# Patient Record
Sex: Female | Born: 1955 | Race: White | Hispanic: No | Marital: Single | Smoking: Never smoker
Health system: Southern US, Community
[De-identification: ages and names within clinical notes are randomized; demographics above are authoritative.]

## PROBLEM LIST (undated history)

## (undated) DIAGNOSIS — I82A19 Acute embolism and thrombosis of unspecified axillary vein: Secondary | ICD-10-CM

## (undated) DIAGNOSIS — G610 Guillain-Barre syndrome: Secondary | ICD-10-CM

## (undated) DIAGNOSIS — Z87442 Personal history of urinary calculi: Secondary | ICD-10-CM

## (undated) HISTORY — DX: Guillain-Barre syndrome: G61.0

## (undated) HISTORY — PX: COLONOSCOPY WITH ESOPHAGOGASTRODUODENOSCOPY (EGD): SHX5779

## (undated) HISTORY — PX: OTHER SURGICAL HISTORY: SHX169

---

## 1988-02-08 DIAGNOSIS — G610 Guillain-Barre syndrome: Secondary | ICD-10-CM

## 1988-02-08 HISTORY — DX: Guillain-Barre syndrome: G61.0

## 2017-09-20 ENCOUNTER — Other Ambulatory Visit: Payer: Self-pay | Admitting: Urology

## 2017-10-10 NOTE — Pre-Procedure Instructions (Signed)
Dr. Amado Coe needs to reschedule procedure 10/13/2017 she will contact Dr. Emmaline Life office. I left a voicemail with Selita also.

## 2017-10-13 ENCOUNTER — Encounter (HOSPITAL_BASED_OUTPATIENT_CLINIC_OR_DEPARTMENT_OTHER): Payer: Self-pay

## 2017-10-13 ENCOUNTER — Ambulatory Visit (HOSPITAL_BASED_OUTPATIENT_CLINIC_OR_DEPARTMENT_OTHER): Admit: 2017-10-13 | Payer: Self-pay | Admitting: Urology

## 2017-10-13 SURGERY — CYSTOSCOPY, WITH BLADDER CALCULUS LITHOLAPAXY
Anesthesia: General

## 2017-11-07 DIAGNOSIS — I82A19 Acute embolism and thrombosis of unspecified axillary vein: Secondary | ICD-10-CM

## 2017-11-07 HISTORY — DX: Acute embolism and thrombosis of unspecified axillary vein: I82.A19

## 2017-11-27 ENCOUNTER — Ambulatory Visit (INDEPENDENT_AMBULATORY_CARE_PROVIDER_SITE_OTHER): Payer: Self-pay

## 2017-11-27 ENCOUNTER — Encounter (INDEPENDENT_AMBULATORY_CARE_PROVIDER_SITE_OTHER): Payer: Self-pay | Admitting: Orthopaedic Surgery

## 2017-11-27 ENCOUNTER — Ambulatory Visit (INDEPENDENT_AMBULATORY_CARE_PROVIDER_SITE_OTHER): Payer: BLUE CROSS/BLUE SHIELD | Admitting: Orthopaedic Surgery

## 2017-11-27 DIAGNOSIS — M25572 Pain in left ankle and joints of left foot: Secondary | ICD-10-CM | POA: Diagnosis not present

## 2017-11-27 DIAGNOSIS — S8265XA Nondisplaced fracture of lateral malleolus of left fibula, initial encounter for closed fracture: Secondary | ICD-10-CM

## 2017-11-27 DIAGNOSIS — M25561 Pain in right knee: Secondary | ICD-10-CM | POA: Diagnosis not present

## 2017-11-27 NOTE — Progress Notes (Signed)
Office Visit Note   Patient: Gabriella Campbell           Date of Birth: 08-14-1955           MRN: [id number] Visit Date: 11/27/2017              Requested by: No referring provider defined for this encounter. PCP: System, Pcp Not In   Assessment & Plan: Visit Diagnoses:  1. Pain in left ankle and joints of left foot   2. Acute pain of right knee   3. Nondisplaced fracture of lateral malleolus of left fibula, initial encounter for closed fracture     Plan: She understands that she does have a stable lateral malleolus fracture of her left ankle.  She will do well in a functional fracture brace or cam walking boot.  The knee seems to doing well so it is going to watch that given there is no fracture.  She can weight-bear as tolerated and we will see her back in 3 weeks with a repeat 3 views of her left ankle.  All question concerns were answered and addressed.  Follow-Up Instructions: Return in about 3 weeks (around 12/18/2017).   Orders:  Orders Placed This Encounter  Procedures  . XR Ankle Complete Left  . XR Foot Complete Left  . XR Knee 1-2 Views Right   No orders of the defined types were placed in this encounter.     Procedures: No procedures performed   Clinical Data: No additional findings.   Subjective: No chief complaint on file. 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  This occurred on day 1 of her traveling when she actually tripped and fell down a step.  She reports global ankle pain and swelling mainly laterally though on the left ankle and anterior knee pain on the right knee.  She did report significant swelling and bruising right after the injury.  HPI  Review of Systems She currently denies any headache, chest pain, short of breath, fever, chills, nausea, vomiting.  Objective: Vital Signs: There were no  vitals taken for this visit.  Physical Exam She is alert and oriented x3 in no acute distress Ortho Exam Examination of her left ankle does show global swelling pain.  Most of her pain is lateral but there is some medial pain and pain over the Achilles but the Achilles is intact.  Her foot is well-perfused with normal sensation and is clinically well located.  Examination of her right knee shows pain to ballottement over the patella and some of the patella tendon itself.  Her extensor mechanism is intact.  There is no medial lateral joint line tenderness and there is no significant effusion or warmth in the joint itself. Specialty Comments:  No specialty comments available.  Imaging: Xr Ankle Complete Left  Result Date: 11/27/2017 3 views of the left ankle show a nondisplaced lateral malleolus fracture distal to the ankle mortise.  The mortise remains intact.  Xr Knee 1-2 Views Right  Result Date: 11/27/2017 2 views of the right knee show no acute findings.  There is slight prepatellar swelling.    PMFS History: There are no active problems to display for this patient.  History reviewed. No pertinent past medical history.  History reviewed. No pertinent family history.  History reviewed. No pertinent surgical history. Social History  Occupational History  . Not on file  Tobacco Use  . Smoking status: Not on file  Substance and Sexual Activity  . Alcohol use: Not on file  . Drug use: Not on file  . Sexual activity: Not on file

## 2017-12-11 ENCOUNTER — Telehealth (INDEPENDENT_AMBULATORY_CARE_PROVIDER_SITE_OTHER): Payer: Self-pay | Admitting: Orthopaedic Surgery

## 2017-12-11 NOTE — Telephone Encounter (Signed)
Please advise 

## 2017-12-11 NOTE — Telephone Encounter (Signed)
Patient left a vm in regards to the A Boot that was put on her foot at the last visit.  It was hard to understand her on the vm, but I believe she said that she is now experiencing some swelling.  CB#[phone].  Thank you.

## 2017-12-11 NOTE — Telephone Encounter (Signed)
Needs to elevate

## 2017-12-12 NOTE — Telephone Encounter (Signed)
Now states pain hurts into her calf

## 2017-12-12 NOTE — Telephone Encounter (Signed)
Can we get her in for a doppler today or tomorrow?

## 2017-12-12 NOTE — Telephone Encounter (Signed)
Gabriella Campbell,  This originated from [place], may have been sent to me in error.

## 2017-12-12 NOTE — Telephone Encounter (Signed)
Needs a dopplar ultrasound to rule out a DVT.

## 2017-12-13 ENCOUNTER — Ambulatory Visit (HOSPITAL_COMMUNITY)
Admission: RE | Admit: 2017-12-13 | Discharge: 2017-12-13 | Disposition: A | Discharge status: Home / Self Care | Payer: BLUE CROSS/BLUE SHIELD | Source: Ambulatory Visit | Attending: Cardiovascular Disease | Admitting: Cardiovascular Disease

## 2017-12-13 ENCOUNTER — Other Ambulatory Visit (INDEPENDENT_AMBULATORY_CARE_PROVIDER_SITE_OTHER): Payer: Self-pay | Admitting: Orthopaedic Surgery

## 2017-12-13 ENCOUNTER — Other Ambulatory Visit (INDEPENDENT_AMBULATORY_CARE_PROVIDER_SITE_OTHER): Payer: Self-pay

## 2017-12-13 ENCOUNTER — Telehealth: Payer: Self-pay | Admitting: *Deleted

## 2017-12-13 ENCOUNTER — Telehealth: Payer: Self-pay | Admitting: Internal Medicine

## 2017-12-13 DIAGNOSIS — M79605 Pain in left leg: Secondary | ICD-10-CM

## 2017-12-13 MED ORDER — RIVAROXABAN (XARELTO) VTE STARTER PACK (15 & 20 MG): ORAL_TABLET | ORAL | 0 refills | Status: DC

## 2017-12-13 MED ORDER — RIVAROXABAN 20 MG PO TABS: 20.0000 mg | ORAL_TABLET | Freq: Every day | ORAL | 1 refills | Status: DC

## 2017-12-13 NOTE — Telephone Encounter (Signed)
Called and spoke with Dr. Amado Coe. She has been scheduled to see MR on 12/19/17 (double booked) she does have prescription of xarelto. Nothing further needed.

## 2017-12-13 NOTE — Telephone Encounter (Signed)
Patient in office for venous doppler, positive for DVT. Per Dr Duke Salvia will start Xarelto 15 mg twice a day for 21 days and then 20 mg daily for total of 3 months. Patient aware and confirmed with [facility] Rx received.

## 2017-12-13 NOTE — Telephone Encounter (Signed)
Pt has appt scheduled today at [facility] heart and vascular at [facility] at 2:00pm, pt aware of appt

## 2017-12-13 NOTE — Telephone Encounter (Signed)
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 She hs new DVT 12/13/2017 after trip tp [place]  Plan  - I am told someone was able to get her a script of xarelto v eliquis - please confirm - Please give her PM appt 30 min mid -afternoon (ok to double book ) or put in 15 min slot for new consult. She told Dr Loreta Ave she wants to see me  Thanks    SIGNATURE    Dr. Kalman Shan, M.D., F.C.C.P,  Pulmonary and Critical Care Medicine Staff Physician, [facility] Center Director - Interstitial Lung Disease  Program  Pulmonary Fibrosis [facility] at  [facility], [place], [address]  Pager: [phone], If no answer or between  15:00h - 7:00h: call [phone] Telephone: [phone]  3:31 PM 12/13/2017

## 2017-12-13 NOTE — Telephone Encounter (Signed)
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  She is in a CAM walker boot due to L lateral malleolar fracture and will remain in it for 9 weeks.  She has no other significant medical history, renal function was within normal limits two months ago, and she has no history of significant bleeding.  He was found to have thrombosis of the tibial vein.  We will plan to anticoagulate with Xarelto 15mg  bid x21 days followed by 20mg  daily for a total of 3 months.  This could potentially be followed with serial ultrasound.  However, given that she will remain with limited mobility, will need a boot, and is low risk for bleed, would favor anticoagulation.    Iram Astorino C. Duke Salvia, MD, [facility] 12/13/2017 3:09 PM

## 2017-12-14 ENCOUNTER — Other Ambulatory Visit: Payer: Self-pay | Admitting: Internal Medicine

## 2017-12-14 DIAGNOSIS — I824Y9 Acute embolism and thrombosis of unspecified deep veins of unspecified proximal lower extremity: Secondary | ICD-10-CM

## 2017-12-18 ENCOUNTER — Ambulatory Visit (INDEPENDENT_AMBULATORY_CARE_PROVIDER_SITE_OTHER): Payer: BLUE CROSS/BLUE SHIELD

## 2017-12-18 ENCOUNTER — Encounter (INDEPENDENT_AMBULATORY_CARE_PROVIDER_SITE_OTHER): Payer: Self-pay | Admitting: Orthopaedic Surgery

## 2017-12-18 ENCOUNTER — Ambulatory Visit (HOSPITAL_COMMUNITY)
Admission: RE | Admit: 2017-12-18 | Discharge: 2017-12-18 | Disposition: A | Discharge status: Home / Self Care | Payer: BLUE CROSS/BLUE SHIELD | Source: Ambulatory Visit | Attending: Cardiology | Admitting: Cardiology

## 2017-12-18 ENCOUNTER — Ambulatory Visit (INDEPENDENT_AMBULATORY_CARE_PROVIDER_SITE_OTHER): Payer: BLUE CROSS/BLUE SHIELD | Admitting: Orthopaedic Surgery

## 2017-12-18 DIAGNOSIS — I824Y2 Acute embolism and thrombosis of unspecified deep veins of left proximal lower extremity: Secondary | ICD-10-CM | POA: Diagnosis not present

## 2017-12-18 DIAGNOSIS — S8265XD Nondisplaced fracture of lateral malleolus of left fibula, subsequent encounter for closed fracture with routine healing: Secondary | ICD-10-CM | POA: Diagnosis not present

## 2017-12-18 DIAGNOSIS — I824Y9 Acute embolism and thrombosis of unspecified deep veins of unspecified proximal lower extremity: Secondary | ICD-10-CM | POA: Insufficient documentation

## 2017-12-18 NOTE — Progress Notes (Signed)
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  She is about a month out from a trip overseas in which she sustained a left ankle injury.  She is found to have a lateral malleolus fracture that was a stable fracture.  She is in a walking boot.  She does get a lot of swelling and pitting edema when she is been up on her leg.  She did have a DVT screen recent with an ultrasound that showed a blood clot in the gastroc area of the leg but not deep.  It was recommended she start a blood thinning medication but she is at least consulted with other physicians and she is going to just follow this right now with serial ultrasounds.  On exam her calf is not significantly swollen.  She still has a little bit of medial and lateral tenderness around the left ankle but her range of motion is doing well and the ankle is stable.  The swelling is significantly less than her last visit.  She still has some pain over the patella on the right side but there is no fracture and her extensor mechanism is intact and there is no knee joint effusion.  Work on transitioning to an ASO for her left ankle this standpoint.  We will see her back for potential final visit in 4 weeks with a repeat 3 views of her left ankle.  All question concerns were answered and addressed.

## 2017-12-18 NOTE — Progress Notes (Signed)
Will route to Dr. Marchelle Gearing as he is seeing the patient this week and can review.   Summary: Right: No evidence of common femoral vein obstruction. Left: Findings consistent with continued deep vein thrombosis involving the left gastocnemius vein, and left posterior tibial vein. Findings appear essentially unchanged compared to previous examination. All other veins visualized appear fully compressible and demonstrate appropriate Doppler characteristics.  Gabriella Headland FNP

## 2017-12-19 ENCOUNTER — Other Ambulatory Visit (INDEPENDENT_AMBULATORY_CARE_PROVIDER_SITE_OTHER): Payer: BLUE CROSS/BLUE SHIELD

## 2017-12-19 ENCOUNTER — Ambulatory Visit (INDEPENDENT_AMBULATORY_CARE_PROVIDER_SITE_OTHER): Payer: BLUE CROSS/BLUE SHIELD | Admitting: Internal Medicine

## 2017-12-19 ENCOUNTER — Telehealth: Payer: Self-pay | Admitting: Internal Medicine

## 2017-12-19 ENCOUNTER — Encounter: Payer: Self-pay | Admitting: Internal Medicine

## 2017-12-19 VITALS — BP 132/84 | HR 96 | Ht 66.0 in | Wt 219.0 lb

## 2017-12-19 DIAGNOSIS — I824Z2 Acute embolism and thrombosis of unspecified deep veins of left distal lower extremity: Secondary | ICD-10-CM

## 2017-12-19 LAB — CBC WITH DIFFERENTIAL/PLATELET
BASOS ABS: 0.1 10*3/uL (ref 0.0–0.1)
Basophils Relative: 0.5 % (ref 0.0–3.0)
Eosinophils Absolute: 0.2 10*3/uL (ref 0.0–0.7)
Eosinophils Relative: 2.2 % (ref 0.0–5.0)
HEMATOCRIT: 39.7 % (ref 36.0–46.0)
HEMOGLOBIN: 13.3 g/dL (ref 12.0–15.0)
LYMPHS PCT: 25.4 % (ref 12.0–46.0)
Lymphs Abs: 2.4 10*3/uL (ref 0.7–4.0)
MCHC: 33.6 g/dL (ref 30.0–36.0)
MCV: 84.4 fl (ref 78.0–100.0)
MONO ABS: 0.8 10*3/uL (ref 0.1–1.0)
Monocytes Relative: 8.1 % (ref 3.0–12.0)
NEUTROS ABS: 5.9 10*3/uL (ref 1.4–7.7)
Neutrophils Relative %: 63.8 % (ref 43.0–77.0)
PLATELETS: 430 10*3/uL — AB (ref 150.0–400.0)
RBC: 4.7 Mil/uL (ref 3.87–5.11)
RDW: 13.6 % (ref 11.5–15.5)
WBC: 9.3 10*3/uL (ref 4.0–10.5)

## 2017-12-19 LAB — HEPATIC FUNCTION PANEL
ALBUMIN: 4.2 g/dL (ref 3.5–5.2)
ALK PHOS: 82 U/L (ref 39–117)
ALT: 4 U/L (ref 0–35)
AST: 11 U/L (ref 0–37)
BILIRUBIN DIRECT: 0 mg/dL (ref 0.0–0.3)
TOTAL PROTEIN: 6.8 g/dL (ref 6.0–8.3)
Total Bilirubin: 0.4 mg/dL (ref 0.2–1.2)

## 2017-12-19 LAB — BASIC METABOLIC PANEL
BUN: 17 mg/dL (ref 6–23)
CALCIUM: 9.4 mg/dL (ref 8.4–10.5)
CO2: 29 mEq/L (ref 19–32)
CREATININE: 0.88 mg/dL (ref 0.40–1.20)
Chloride: 103 mEq/L (ref 96–112)
GFR: 69.5 mL/min (ref >60.00)
Glucose, Bld: 101 mg/dL — ABNORMAL HIGH (ref 70–99)
Potassium: 4.1 mEq/L (ref 3.5–5.1)
Sodium: 139 mEq/L (ref 135–145)

## 2017-12-19 LAB — D-DIMER, QUANTITATIVE (NOT AT ARMC): D DIMER QUANT: 1.19 ug{FEU}/mL — AB (ref <0.50)

## 2017-12-19 MED ORDER — APIXABAN 2.5 MG PO TABS: 2.5000 mg | ORAL_TABLET | Freq: Two times a day (BID) | ORAL | 0 refills | Status: DC

## 2017-12-19 NOTE — Progress Notes (Addendum)
Subjective:    Patient ID: Gabriella Campbell, female    DOB: July 31, 1955, 62 y.o.   MRN: [id number]  HPI   IOV 12/19/2017  Chief Complaint  Patient presents with  . Consult    DVT in left leg.    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  She is originally from [place].  Her primary home based on the [place] is [place] but but now living in [place], [place].  Over a month ago she went to [place] with some of her friends and on her second day while rushing to get into the hotel entry she tripped and fell and and sustained a left foot injury.  She was unaware that this later would be diagnosed as a left lateral malleolus fracture and left fibula fracture per hx.  She spent 8 days and agrees weightbearing with the ankle and continuing with activities.  She returned approximately 3 weeks ago and then shortly thereafter started noticing left ankle swelling with pain.  Then on November 27, 2017 was diagnosed formally with the left lateral malleolus fracture.  She is in a boot since then.  Somewhere along the way she started noticing left calf pain and swelling.  Then on December 13, 2017 had a duplex lower extremity that showed below-knee DVT in the left gastrocnemius in the left posterior tibial vein.  This was done at the cardiology clinic.  She was given Xarelto.  However she is nervous about anticoagulation because of bleeding risk.  Therefore she made this appointment and is here to discuss anticoagulation treatment for the DVT.  She does not have a local primary care physician.  She has not had any shortness of breath.  She has reviewed the literature extensively.  She had a follow-up duplex ultrasound number 12/2017 yesterday and the DVT is stable without progression.  Based on literature review she believes that she is at low risk for progression and she believes the best approach is to do serial monitoring of duplex  ultrasound.  She has started herself on a baby dose aspirin.  She says she is called [facility] [place] [facility] and there they do not treat below-knee lower extremity DVT with anticoagulation especially of the risk for progression is low.  However she believes in [place] doctors recommend anticoagulation.  She has upcoming trip to [place] by flight over Thanksgiving.  She continues to be in immobilizer boot for her left lower extremity.   She has never had deep vein thrombosis or pulmonary embolism in the past.  No history of malignancy.  No smoking.  No hormone replacement treatment.  No tamoxifen.  No family history of blood clots.  No other risk factors for blood clots.  History of bleeding reviewed.  She has normal bleeding tendencies.  No history of abnormal bleeding.  She believes her lab work if that would be normal.  She has never had any cranial trauma.  Based on risk v benefit - she is willing / open to idea of doing low dose anticoagulation with NOAC (eliquis preferred) for 4 weeks with serial duplex during this time with D-dimer risk assessment    has a past medical history of Guillain Barr syndrome (HCC).   reports that she has never smoked. She has never used smokeless tobacco.  No past surgical history on file.  Allergies  Allergen Reactions  . Penicillins      There is no immunization history  on file for this patient.  No family history on file.   Current Outpatient Medications:  .  aspirin 81 MG tablet, Take 162 mg by mouth daily., Disp: , Rfl:  .  apixaban (ELIQUIS) 2.5 MG TABS tablet, Take 1 tablet (2.5 mg total) by mouth 2 (two) times daily., Disp: 84 tablet, Rfl: 0    Review of Systems  Constitutional: Negative for fever.  HENT: Negative for congestion, dental problem, ear pain, nosebleeds, postnasal drip, rhinorrhea, sinus pressure, sneezing, sore throat and trouble swallowing.   Eyes: Negative for redness and itching.  Respiratory: Negative  for cough, chest tightness, shortness of breath and wheezing.   Cardiovascular: Positive for leg swelling. Negative for palpitations.  Gastrointestinal: Negative for nausea and vomiting.  Genitourinary: Negative for dysuria.  Musculoskeletal: Negative for joint swelling.  Skin: Negative for rash.  Allergic/Immunologic: Negative.  Negative for environmental allergies, food allergies and immunocompromised state.  Neurological: Negative for headaches.  Hematological: Does not bruise/bleed easily.  Psychiatric/Behavioral: Negative for dysphoric mood. The patient is not nervous/anxious.        Objective:   Physical Exam  Constitutional: She is oriented to person, place, and time. She appears well-developed and well-nourished. No distress.  HENT:  Head: Normocephalic and atraumatic.  Right Ear: External ear normal.  Left Ear: External ear normal.  Mouth/Throat: Oropharynx is clear and moist. No oropharyngeal exudate.  Eyes: Pupils are equal, round, and reactive to light. Conjunctivae and EOM are normal. Right eye exhibits no discharge. Left eye exhibits no discharge. No scleral icterus.  Neck: Normal range of motion. Neck supple. No JVD present. No tracheal deviation present. No thyromegaly present.  Cardiovascular: Normal rate, regular rhythm, normal heart sounds and intact distal pulses. Exam reveals no gallop and no friction rub.  No murmur heard. Pulmonary/Chest: Effort normal and breath sounds normal. No respiratory distress. She has no wheezes. She has no rales. She exhibits no tenderness.  Abdominal: Soft. Bowel sounds are normal. She exhibits no distension and no mass. There is no tenderness. There is no rebound and no guarding.  Musculoskeletal: Normal range of motion. She exhibits no edema or tenderness.  LLE in boot and ted stockings  Lymphadenopathy:    She has no cervical adenopathy.  Neurological: She is alert and oriented to person, place, and time. She has normal reflexes. No  cranial nerve deficit. She exhibits normal muscle tone. Coordination normal.  Skin: Skin is warm and dry. No rash noted. She is not diaphoretic. No erythema. No pallor.  Psychiatric: She has a normal mood and affect. Her behavior is normal. Judgment and thought content normal.  Vitals reviewed.   Vitals:   12/19/17 1453  BP: 132/84  Pulse: 96  SpO2: 96%  Weight: 219 lb (99.3 kg)  Height: 5\' 6"  (1.676 m)    Estimated body mass index is 35.35 kg/m as calculated from the following:   Height as of this encounter: 5\' 6"  (1.676 m).   Weight as of this encounter: 219 lb (99.3 kg).       Assessment & Plan:     ICD-10-CM   1. Acute deep vein thrombosis (DVT) of distal vein of left lower extremity (HCC) I82.4Z2 D-Dimer, Quantitative    CBC with Differential    Basic Metabolic Panel (BMET)    Hepatic function panel    VAS [place] LOWER EXTREMITY VENOUS (DVT)  \   Patient Instructions     ICD-10-CM   1. Acute deep vein thrombosis (DVT) of distal vein of  left lower extremity (HCC) I82.4Z2     Stable between 12/13/17 and 12/16/17 - distal left lower extremity  - related to travel and fracture  Risk factors for progression per uptodate  Patients considered by their clinician to be at risk of extension to the proximal veins. This includes patients with:  -Unprovoked DVT -D-dimer >500 ng/mL -Extensive thrombosis involving multiple veins (eg, >5 cm in length, >7 mm in diameter) -Thrombosis close to the proximal veins -Persistent/irreversible risk factors such as active cancer [25] -Prior DVT or PE -Prolonged immobility -Inpatient status - No progression in 2 weeks on serial US  PLAN - agree in your risk for progression is low but prolonged cast and upcoming flight travel do make me concerned there is some risk for progression  - check D-dimer 12/19/2017 - to assess progression risk  - check cbc, bmet, lft 12/19/2017  to assess bleeding risk - HAS-BLEED SCORE - per uptodate Most  clinicians agree that patients with a three-month bleeding risk of less than 2 percent (low risk) should be anticoagulated. In addition, most clinicians agree that patients with a three-month bleeding risk of more than 13 percent (high risk) should not be anticoagulated  - check repeat US of legs in 1 week  - we agreed to do eliquis low dose 2.5mg  twice daily for 4 weeks and reasess - samples given  - you can hjold off baby aspirin while taking eliquis  Followup = await my call with results 12/20/17 - see you in 4-6 weeks followup - be in touch over phone     SIGNATURE    Dr. Kalman ShanMurali Beni Turrell, M.D., F.C.C.P,  Pulmonary and Critical Care Medicine Staff Physician, [facility] Center Director - Interstitial Lung Disease  Program  Pulmonary Fibrosis [facility] at [facility] [place], [place], [id number]  Pager: [phone], If no answer or between  15:00h - 7:00h: call [phone] Telephone: [phone]  5:36 PM 12/19/2017

## 2017-12-19 NOTE — Patient Instructions (Addendum)
ICD-10-CM   1. Acute deep vein thrombosis (DVT) of distal vein of left lower extremity (HCC) I82.4Z2     Stable between 12/13/17 and 12/16/17 - distal left lower extremity  - related to travel and fracture  Risk factors for progression per uptodate  Patients considered by their clinician to be at risk of extension to the proximal veins. This includes patients with:  -Unprovoked DVT -D-dimer >500 ng/mL -Extensive thrombosis involving multiple veins (eg, >5 cm in length, >7 mm in diameter) -Thrombosis close to the proximal veins -Persistent/irreversible risk factors such as active cancer [25] -Prior DVT or PE -Prolonged immobility -Inpatient status - No progression in 2 weeks on serial US  PLAN - agree in your risk for progression is low but prolonged cast and upcoming flight travel do make me concerned there is some risk for progression  - check D-dimer 12/19/2017 - to assess progression risk  - check cbc, bmet, lft 12/19/2017  to assess bleeding risk - HAS-BLEED SCORE - per uptodate Most clinicians agree that patients with a three-month bleeding risk of less than 2 percent (low risk) should be anticoagulated. In addition, most clinicians agree that patients with a three-month bleeding risk of more than 13 percent (high risk) should not be anticoagulated  - check repeat US of legs in 1 week  - we agreed to do eliquis low dose 2.5mg  twice daily for 4 weeks and reasess - samples given  - you can hjold off baby aspirin while taking eliquis  Followup = await my call with results 12/20/17 - see you in 4-6 weeks followup - be in touch over phone

## 2017-12-20 NOTE — Progress Notes (Signed)
These results were reviewed with Dr. Marchelle Gearingamaswamy at office visit on 12/19/2017 see documentation.  Elisha HeadlandBrian Earle Burson FNP

## 2017-12-20 NOTE — Telephone Encounter (Signed)
Please let Gabriella Campbell know that labs normal but for - d-dimer high 1.19 and also platelet around 430.  Without aspirin her bleeding risk score is very very low. So, acceptable risk for low dose eliquis 2.5mg  twice daily which I think she should take giving high d-dimer . Rest of plan per OV yesterday

## 2017-12-21 NOTE — Telephone Encounter (Signed)
Called and spoke with pt letting her know the results of the labwork and based on the d-dimer, MR said it would be okay for her to do low dose eliquis 2.5mg  bid.  Pt expressed understanding. Nothing further needed.

## 2017-12-25 ENCOUNTER — Ambulatory Visit (HOSPITAL_COMMUNITY)
Admission: RE | Admit: 2017-12-25 | Discharge: 2017-12-25 | Disposition: A | Discharge status: Home / Self Care | Payer: BLUE CROSS/BLUE SHIELD | Source: Ambulatory Visit | Attending: Cardiology | Admitting: Cardiology

## 2017-12-25 DIAGNOSIS — I824Z2 Acute embolism and thrombosis of unspecified deep veins of left distal lower extremity: Secondary | ICD-10-CM | POA: Diagnosis not present

## 2017-12-26 ENCOUNTER — Telehealth: Payer: Self-pay | Admitting: Internal Medicine

## 2017-12-26 DIAGNOSIS — I82462 Acute embolism and thrombosis of left calf muscular vein: Secondary | ICD-10-CM

## 2017-12-26 NOTE — Telephone Encounter (Signed)
Attempted to call pt but unable to reach her and unable to leave a VM due to no machine kicking in. Will try to call her back later.

## 2017-12-26 NOTE — Telephone Encounter (Signed)
Gabriella Campbell  Please let Dr Amado CoeFein know that there continues to be DVT LLE but without progression   Left: Findings consistent with continued deep vein thrombosis involving the left gastrocnemius vein, and left posterior tibial vein. Findings appear essentially unchanged compared to previous two examinations. All other veins visualized appear fully  compressible without evidence of thrombus and demonstrate appropriate Doppler characteristics. No cystic structure found in the popliteal fossa.   PLAN - continue eliquis low dose - repeat duplex left lower extremity alone in aproximately 10 days

## 2017-12-27 NOTE — Telephone Encounter (Signed)
Patient returning call, CB is [phone]

## 2017-12-27 NOTE — Telephone Encounter (Signed)
Since I have been unable to reach pt, I have sent pt a mychart message as she has an active mychart. In the message, I stated to her to call our office so we can make sure she did receive the mychart message and also so we can get things taken care of with scheduling the follow up doppler.

## 2017-12-27 NOTE — Telephone Encounter (Signed)
Spoke patient, states she was returning Emily's call. States she needs the doppler done 09.26.19. She will be leaving 09.27.19. She wanted to make MR aware she just started the eliquis today due to some dental work and complications. Order for doppler placed and specifications noted in comments. Will route to MR as FYI.

## 2017-12-28 NOTE — Addendum Note (Signed)
Addended by: Kerin RansomBLACKWELL, Sheri Prows on: 12/28/2017 10:30 AM   Modules accepted: Orders

## 2017-12-29 ENCOUNTER — Telehealth: Payer: Self-pay | Admitting: Internal Medicine

## 2017-12-29 DIAGNOSIS — I82462 Acute embolism and thrombosis of left calf muscular vein: Secondary | ICD-10-CM

## 2017-12-29 NOTE — Telephone Encounter (Signed)
Ok I ordered the venous doppler STAT so she can have this done on Tuesday, [facility]I [facility]

## 2017-12-29 NOTE — Telephone Encounter (Signed)
[name] texted me last night. [place]. So she wans her serial duppler - just Left Lower Extremity alone for DVT progression - do this on 01/02/18 Tuesday. Please order   Thanks    SIGNATURE    Dr. Kalman ShanMurali [name], M.D., F.C.C.P,  Pulmonary and Critical Care Medicine Staff Physician, [facility] Center Director - Interstitial Lung Disease  Program  Pulmonary Fibrosis [facility] Network at [facility] [place], [place], [id number]  Pager: [phone], If no answer or between  15:00h - 7:00h: call [phone] Telephone: [phone]  9:41 AM 12/29/2017

## 2017-12-29 NOTE — Telephone Encounter (Signed)
I will place the order now to be done on Tuesday. Will close this encounter.

## 2017-12-29 NOTE — Telephone Encounter (Signed)
Received the below mychart message from pt. MR, please advise on this for pt. Thanks!   Mychart message:  Gabriella AlexandersHi Gabriella Campbell just checking on the emails and follow up calls to check that Gabriella Campbell knows I only started the Eliquis yesterday and [place] Will one week of Eliquis coverage be sufficient for the flight and do I need a repeat US prior to flying as I am on low dose and this weeks showed no change Thanks

## 2017-12-29 NOTE — Telephone Encounter (Signed)
Sched for 11/26 at 12:00.  Gave appt info to pt.  Nothing further needed.

## 2017-12-29 NOTE — Telephone Encounter (Signed)
I sent a phone note to triage on this few hours ago - yes get it tiuesday 01/02/18

## 2017-12-29 NOTE — Telephone Encounter (Signed)
I am working on this order

## 2018-01-02 ENCOUNTER — Encounter (HOSPITAL_COMMUNITY): Payer: Self-pay

## 2018-01-02 ENCOUNTER — Ambulatory Visit (HOSPITAL_COMMUNITY)
Admission: RE | Admit: 2018-01-02 | Discharge: 2018-01-02 | Disposition: A | Discharge status: Home / Self Care | Payer: BLUE CROSS/BLUE SHIELD | Source: Ambulatory Visit | Attending: Internal Medicine | Admitting: Internal Medicine

## 2018-01-02 DIAGNOSIS — I82462 Acute embolism and thrombosis of left calf muscular vein: Secondary | ICD-10-CM | POA: Diagnosis present

## 2018-01-03 ENCOUNTER — Telehealth: Payer: Self-pay | Admitting: Internal Medicine

## 2018-01-03 NOTE — Telephone Encounter (Signed)
Continued LLE DVT but no progression  Plan - continue eliquis as before

## 2018-01-03 NOTE — Telephone Encounter (Signed)
Sent pt a message via mychart with the information in regards to the doppler and stated to her to continue eliquis as before. Nothing further needed.

## 2018-01-15 ENCOUNTER — Ambulatory Visit (INDEPENDENT_AMBULATORY_CARE_PROVIDER_SITE_OTHER): Payer: BLUE CROSS/BLUE SHIELD | Admitting: Orthopaedic Surgery

## 2018-01-18 ENCOUNTER — Ambulatory Visit (INDEPENDENT_AMBULATORY_CARE_PROVIDER_SITE_OTHER): Payer: BLUE CROSS/BLUE SHIELD | Admitting: Orthopaedic Surgery

## 2018-01-18 ENCOUNTER — Encounter (INDEPENDENT_AMBULATORY_CARE_PROVIDER_SITE_OTHER): Payer: Self-pay | Admitting: Orthopaedic Surgery

## 2018-01-18 ENCOUNTER — Encounter: Payer: Self-pay | Admitting: Internal Medicine

## 2018-01-18 ENCOUNTER — Ambulatory Visit (INDEPENDENT_AMBULATORY_CARE_PROVIDER_SITE_OTHER): Payer: BLUE CROSS/BLUE SHIELD

## 2018-01-18 ENCOUNTER — Ambulatory Visit (INDEPENDENT_AMBULATORY_CARE_PROVIDER_SITE_OTHER): Payer: BLUE CROSS/BLUE SHIELD | Admitting: Internal Medicine

## 2018-01-18 VITALS — BP 132/78 | HR 78 | Ht 66.0 in | Wt 232.0 lb

## 2018-01-18 DIAGNOSIS — I824Z2 Acute embolism and thrombosis of unspecified deep veins of left distal lower extremity: Secondary | ICD-10-CM

## 2018-01-18 DIAGNOSIS — S8265XD Nondisplaced fracture of lateral malleolus of left fibula, subsequent encounter for closed fracture with routine healing: Secondary | ICD-10-CM

## 2018-01-18 DIAGNOSIS — S8265XA Nondisplaced fracture of lateral malleolus of left fibula, initial encounter for closed fracture: Secondary | ICD-10-CM

## 2018-01-18 NOTE — Progress Notes (Signed)
OV 01/18/2018  Subjective:  Patient ID: Gabriella Campbell, female , DOB: 11/22/1955 , age 62 y.o. , MRN: [id number] , ADDRESS: [address] [place] [address]   01/18/2018 -   Chief Complaint  Patient presents with  . Follow-up    no current sx.      [name] 62 y.o. -presents for follow-up of her left lower extremity below-knee DVT.  She is now on low-dose Eliquis therapy for the last 3 weeks.  We discussed extensively and agreed to approach this with serial ultrasound and doing low-dose Eliquis therapy.  Initial plan is to do Eliquis therapy for 4 weeks.  Subsequently she did discuss with Dr. Cyndie ChimeGranfortuna who has recommended 6 weeks of Eliquis therapy.  At this point in time she has had several serial ultrasounds all of which shows stability of the lower extremity below-knee DVT.  She wants to have a DVT in 1 week which would be at the end of 4 weeks of Eliquis therapy and then decide if she wants to stop the Eliquis or go ahead with 2 more weeks of Eliquis to complete a 6-week therapy.  Overall she is doing well.  There is some calf cramping which could be because of the boots.  There is no cough or shortness of breath or chest pain or hemoptysis or bleeding episodes.     ROS - per HPI     has a past medical history of Guillain Barr syndrome (HCC).   reports that she has never smoked. She has never used smokeless tobacco.  No past surgical history on file.  Allergies  Allergen Reactions  . Penicillins      There is no immunization history on file for this patient.  No family history on file.   Current Outpatient Medications:  .  apixaban (ELIQUIS) 2.5 MG TABS tablet, Take 1 tablet (2.5 mg total) by mouth 2 (two) times daily., Disp: 84 tablet, Rfl: 0      Objective:   Vitals:   01/18/18 1627  BP: 132/78  Pulse: 78  SpO2: 98%  Weight: 232 lb (105.2 kg)  Height: 5\' 6"  (1.676 m)    Estimated body mass index is 37.45 kg/m  as calculated from the following:   Height as of this encounter: 5\' 6"  (1.676 m).   Weight as of this encounter: 232 lb (105.2 kg).  @WEIGHTCHANGE @  [employer]   01/18/18 1627  Weight: 232 lb (105.2 kg)     Physical Exam Neuro: Alert and Oriented x 3. GCS 15. Speech normal Psych: Pleasant Resp: Clear to ausucultation bilaterally. No wheeze No crackles HEENT: Normal upper airway. PEERL +. No post nasal drip Left lower extremity in boots        Assessment:       ICD-10-CM   1. Acute deep vein thrombosis (DVT) of distal vein of left lower extremity (HCC) I82.4Z2        Plan:     Patient Instructions     ICD-10-CM   1. Acute deep vein thrombosis (DVT) of distal vein of left lower extremity (HCC) I82.4Z2     Glad you are doing well 3 weeks of low-dose Eliquis therapy  Plan -Repeat lower extremity duplex ultrasound on the left side in 1 week -No flu shot ever for you given the history of Guillain Barr syndrome  Follow-up -We will call you with the results of that ultrasound in 1 week (also you can  be directly in touch with me via phone] -Based on these results we can decide whether to stop Eliquis at that 4 weeks point or continue for 2 more weeks which would mean 6 weeks of therapy       SIGNATURE    Dr. Kalman Shan, M.D., F.C.C.P,  Pulmonary and Critical Care Medicine Staff Physician, [facility] Center Director - Interstitial Lung Disease  Program  Pulmonary Fibrosis [facility] Network at [facility] [place], [place], [address]  Pager: [phone], If no answer or between  15:00h - 7:00h: call [phone] Telephone: [phone]  5:17 PM 01/18/2018

## 2018-01-18 NOTE — Patient Instructions (Signed)
ICD-10-CM   1. Acute deep vein thrombosis (DVT) of distal vein of left lower extremity (HCC) I82.4Z2     Glad you are doing well 3 weeks of low-dose Eliquis therapy  Plan -Repeat lower extremity duplex ultrasound on the left side in 1 week -No flu shot ever for you given the history of Guillain Barr syndrome  Follow-up -We will call you with the results of that ultrasound in 1 week (also you can be directly in touch with me via phone] -Based on these results we can decide whether to stop Eliquis at that 4 weeks point or continue for 2 more weeks which would mean 6 weeks of therapy

## 2018-01-18 NOTE — Progress Notes (Signed)
The patient is a very pleasant 62 year old female who is now getting close to 2 months status post sustaining a left lateral malleolus fracture of her distal fibula below the level of the ankle mortise.  She has been weightbearing as tolerated in a cam walking boot.  She has been struggling with some peripheral edema and dependent edema.  Some of this is been pitting.  She is a physician as well.  She is on low-dose Eliquis due to a DVT.  On exam the swelling is gone down dramatically but it is still present.  She still has pain in her ankle but range of motion is improving overall.  Her Achilles is intact.  Her foot is neurovascular intact.  3 views of the left ankle shows a the fracture is healing significantly with no displacement at all and has had interval changes since last x-rays.  At this point we will see her back for hopefully one more visit in 4 weeks from now to see how she is doing overall.  We may put her through physical therapy then only if she needs it.  She can expect swelling 5 to 6 months.  We will get a final 3 views of her left ankle that visit.

## 2018-01-19 ENCOUNTER — Inpatient Hospital Stay (HOSPITAL_COMMUNITY): Admission: RE | Admit: 2018-01-19 | Payer: BLUE CROSS/BLUE SHIELD | Source: Ambulatory Visit

## 2018-01-25 ENCOUNTER — Ambulatory Visit (HOSPITAL_COMMUNITY)
Admission: RE | Admit: 2018-01-25 | Discharge: 2018-01-25 | Disposition: A | Discharge status: Home / Self Care | Payer: BLUE CROSS/BLUE SHIELD | Source: Ambulatory Visit | Attending: Cardiology | Admitting: Cardiology

## 2018-01-25 ENCOUNTER — Telehealth: Payer: Self-pay | Admitting: Internal Medicine

## 2018-01-25 DIAGNOSIS — I824Z2 Acute embolism and thrombosis of unspecified deep veins of left distal lower extremity: Secondary | ICD-10-CM | POA: Diagnosis present

## 2018-01-25 MED ORDER — APIXABAN 2.5 MG PO TABS: 2.5000 mg | ORAL_TABLET | Freq: Two times a day (BID) | ORAL | 0 refills | Status: DC

## 2018-01-25 NOTE — Telephone Encounter (Signed)
Gabriella PortlandMelanie Campbell  just sent a text. Repeat US apaprently shows dvt persistence. Please order another 3 weeks of eliquis 2.5mg  twice daily per her request. She can also come and pick up samples for 3 weeks if we have it  Thanks    SIGNATURE    Dr. Kalman ShanMurali Aunika Kirsten, M.D., F.C.C.P,  Pulmonary and Critical Care Medicine Staff Physician, [facility] Center Director - Interstitial Lung Disease  Program  Pulmonary Fibrosis [facility] at [facility] [place], [place], [address]  Pager: [phone], If no answer or between  15:00h - 7:00h: call [phone] Telephone: [phone]  3:50 PM 01/25/2018       Current Outpatient Medications on File Prior to Visit  Medication Sig Dispense Refill  . apixaban (ELIQUIS) 2.5 MG TABS tablet Take 1 tablet (2.5 mg total) by mouth 2 (two) times daily. 84 tablet 0   No current facility-administered medications on file prior to visit.

## 2018-01-25 NOTE — Telephone Encounter (Signed)
Called and spoke with patient about MR response below. Patient wanted medication sent to [facility] at [address]. Medication sent. Nothing further needed.

## 2018-01-26 ENCOUNTER — Other Ambulatory Visit: Payer: Self-pay | Admitting: *Deleted

## 2018-01-26 MED ORDER — APIXABAN 2.5 MG PO TABS: 2.5000 mg | ORAL_TABLET | Freq: Two times a day (BID) | ORAL | 0 refills | Status: DC

## 2018-01-26 NOTE — Progress Notes (Signed)
Received a message from MR from pt stating the Rx was not sent to pt's pharmacy yesterday after she was told the Rx was being sent in.  Looked at the Rx and saw that it said it was sent in but when looking further at the Rx that was to be sent in 12/19, it was placed as sample instead of being sent to pt's pharmacy.  I have fixed the Rx and sent it to pt's pharmacy for her.  Called pt's pharmacy and spoke with Tiffany to see if she was able to see the refill of pt's Rx that I sent in. Per Tiffany, they did receive the Rx and pt can be able to pick up Rx in about an hour.  Called and spoke with pt letting her know this information. Pt expressed understanding. Nothing further needed.

## 2018-02-15 ENCOUNTER — Encounter (INDEPENDENT_AMBULATORY_CARE_PROVIDER_SITE_OTHER): Payer: Self-pay | Admitting: Orthopaedic Surgery

## 2018-02-15 ENCOUNTER — Ambulatory Visit (INDEPENDENT_AMBULATORY_CARE_PROVIDER_SITE_OTHER): Payer: BLUE CROSS/BLUE SHIELD

## 2018-02-15 ENCOUNTER — Ambulatory Visit (INDEPENDENT_AMBULATORY_CARE_PROVIDER_SITE_OTHER): Payer: BLUE CROSS/BLUE SHIELD | Admitting: Orthopaedic Surgery

## 2018-02-15 DIAGNOSIS — S8265XD Nondisplaced fracture of lateral malleolus of left fibula, subsequent encounter for closed fracture with routine healing: Secondary | ICD-10-CM | POA: Diagnosis not present

## 2018-02-15 NOTE — Progress Notes (Signed)
The patient is now about 10 weeks status post a left ankle lateral malleolus fracture.  This was a nondisplaced fracture but is significant soft tissue swelling is global around her ankle.  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  She still uses her small cam walking boot for comfort purposes.  She still had a significant bout of swelling with her ankle.  She is on Eliquis for DVT.  She is going to bleed 3 months likely of treatment for this.  On exam the soft tissue swelling is down significantly of her left ankle and her range of motion is improving.  The ankle feels loosely stable.  3 views of left ankle obtained show it is healed in terms of the lateral malleolus fracture.  The soft tissue swelling is also decreased dramatically when comparing to her injury films in October 2019.  This point I do feel that she would benefit from outpatient physical therapy to work on left ankle strengthening and range of motion as well as decreasing her swelling and hopefully working on her proprioception and balance.  I will see her back in about 6 weeks to see how she is doing overall but at that point no x-rays are needed.

## 2018-02-16 ENCOUNTER — Other Ambulatory Visit (INDEPENDENT_AMBULATORY_CARE_PROVIDER_SITE_OTHER): Payer: Self-pay

## 2018-02-16 DIAGNOSIS — S8265XD Nondisplaced fracture of lateral malleolus of left fibula, subsequent encounter for closed fracture with routine healing: Secondary | ICD-10-CM

## 2018-02-21 ENCOUNTER — Encounter: Payer: Self-pay | Admitting: Physical Therapy

## 2018-02-21 ENCOUNTER — Other Ambulatory Visit: Payer: Self-pay

## 2018-02-21 ENCOUNTER — Ambulatory Visit: Payer: BLUE CROSS/BLUE SHIELD | Attending: Orthopaedic Surgery | Admitting: Physical Therapy

## 2018-02-21 DIAGNOSIS — R600 Localized edema: Secondary | ICD-10-CM | POA: Diagnosis present

## 2018-02-21 DIAGNOSIS — M25572 Pain in left ankle and joints of left foot: Secondary | ICD-10-CM | POA: Diagnosis not present

## 2018-02-21 DIAGNOSIS — M6281 Muscle weakness (generalized): Secondary | ICD-10-CM | POA: Diagnosis present

## 2018-02-21 DIAGNOSIS — R262 Difficulty in walking, not elsewhere classified: Secondary | ICD-10-CM | POA: Diagnosis present

## 2018-02-21 DIAGNOSIS — M25672 Stiffness of left ankle, not elsewhere classified: Secondary | ICD-10-CM | POA: Diagnosis present

## 2018-02-21 NOTE — Therapy (Signed)
[facility] [address]  [address] [place], [place], [address] Phone: [phone]   Fax:  [phone]  Physical Therapy Evaluation  Patient Details  Name: Gabriella Campbell MRN: [id number] Date of Birth: Jun 17, 1955 Referring Provider (PT): Doneen Poisson, MD   Encounter Date: 02/21/2018  PT End of Session - 02/21/18 1618    Visit Number  1    Number of Visits  17    Date for PT Re-Evaluation  04/18/18    Authorization Type  [employer]    PT Start Time  1527    PT Stop Time  1619   ice pack   PT Time Calculation (min)  52 min    Equipment Utilized During Treatment  --   R cam boot   Activity Tolerance  Patient tolerated treatment well;Patient limited by pain    Behavior During Therapy  [facility] for tasks assessed/performed       Past Medical History:  Diagnosis Date  . Guillain Barr syndrome [facility])     History reviewed. No pertinent surgical history.  There were no vitals filed for this visit.   Subjective Assessment - 02/21/18 1529    Subjective  Patient reports that on 11/18/17 she was on vacation in [place] and fell downstairs, inverting and then everting her foot. Had considerable soft tissue swelling that is improved now. Has been walking in the cam boot for 10 weeks now. Has been on blood thinners for 8 weeks for current DVT in L gastroc and posterior tibialis. 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  Reports pain levels are mild, with worst at 5-6/10. Limited in prolonged standing, walking, having some stiffness, and Increased sensitivity to lateral malleolus. Denies N/T. Reports that MD instructed to wear cam boot PRN; was given ASO brace but did not find it comfortable.     Pertinent History  GBS    Limitations  Lifting;Standing;Walking;House hold activities    How long can you sit comfortably?  1 hour d/t swelling    How long can  you stand comfortably?  30 min    How long can you walk comfortably?  15 min    Diagnostic tests  02/15/18 L ankle xray: 3 views of the left ankle show a healed Weber a lateral malleolus fracture. The ankle mortise is intact. The soft tissue swelling is decreased when compared to previous injury films from October 2019.     Patient Stated Goals  100% recovery, full motion, being able to be on feet all day    Currently in Pain?  Yes    Pain Score  0-No pain    Pain Location  Ankle    Pain Orientation  Left    Pain Descriptors / Indicators  Cramping;Aching    Pain Type  Chronic pain         [facility] PT Assessment - 02/21/18 1540      Assessment   Medical Diagnosis  Closed nondisplaced fx of lateral malleolus of L fibula    Referring Provider (PT)  Doneen Poisson, MD    Onset Date/Surgical Date  11/18/17    Next MD Visit  03/29/18    Prior Therapy  No      Precautions   Precautions  --   current DVT in L gastroc and pos tib- no [place], e-stim, vaso     Restrictions   Weight Bearing Restrictions  --  FWBing, in cam boot PRN     Balance Screen   Has the patient fallen in the past 6 months  Yes    How many times?  1    Has the patient had a decrease in activity level because of a fear of falling?   No    Is the patient reluctant to leave their home because of a fear of falling?   No      Home Environment   Living Environment  Private residence    Type of Home  Apartment    Home Access  Stairs to enter    Entrance Stairs-Number of Steps  6    Entrance Stairs-Rails  Right;Left;Cannot reach both    Home Layout  One level    Home Equipment  [place] - single point;Walker - 2 wheels      Prior Function   Level of Independence  Independent    Vocation  Full time employment    Vocation Requirements  physician- walking, standing    Leisure  travelling, walking      Cognition   Overall Cognitive Status  Within Functional Limits for tasks assessed      Observation/Other  Assessments   Focus on Therapeutic Outcomes (FOTO)   Lower leg: 40 (60% limited, 41% predicted)      Sensation   Light Touch  Appears Intact      Coordination   Gross Motor Movements are Fluid and Coordinated  Yes      Posture/Postural Control   Posture/Postural Control  Postural limitations    Postural Limitations  Rounded Shoulders;Forward head;Posterior pelvic tilt      ROM / Strength   AROM / PROM / Strength  AROM;PROM;Strength      AROM   AROM Assessment Site  Ankle   measured with knee in extension   Right/Left Ankle  Right;Left    Right Ankle Dorsiflexion  15    Right Ankle Plantar Flexion  60    Right Ankle Inversion  32    Right Ankle Eversion  25    Left Ankle Dorsiflexion  -5    Left Ankle Plantar Flexion  47    Left Ankle Inversion  10    Left Ankle Eversion  10      PROM   PROM Assessment Site  Ankle    Right/Left Ankle  Right;Left    Right Ankle Dorsiflexion  21    Right Ankle Plantar Flexion  63    Right Ankle Inversion  38    Right Ankle Eversion  30    Left Ankle Dorsiflexion  5   discomfort   Left Ankle Plantar Flexion  55   discomfort   Left Ankle Inversion  23   lateral malleolus pain   Left Ankle Eversion  23   disomfort     Strength   Strength Assessment Site  Hip;Knee;Ankle    Right/Left Hip  Right;Left    Right Hip Flexion  4+/5    Right Hip ABduction  4+/5    Right Hip ADduction  4+/5    Left Hip Flexion  4+/5    Left Hip ABduction  4+/5    Left Hip ADduction  4+/5    Right/Left Knee  Right;Left    Right Knee Flexion  4/5    Right Knee Extension  5/5    Left Knee Flexion  4/5    Left Knee Extension  5/5    Right/Left Ankle  Right;Left  Right Ankle Dorsiflexion  4+/5    Right Ankle Plantar Flexion  4+/5    Right Ankle Inversion  4+/5    Right Ankle Eversion  4+/5    Left Ankle Dorsiflexion  4/5    Left Ankle Plantar Flexion  4/5    Left Ankle Inversion  4/5    Left Ankle Eversion  4/5      Palpation   Palpation comment  L  ankle TTP along achilles tendon, edema to medial and lateral malleolus, dorsum of foot, along distal anterior tibialis; no tenderness along posterior calf      Ambulation/Gait   Gait Pattern  Step-through pattern;Decreased dorsiflexion - left;Decreased hip/knee flexion - left;Decreased stance time - left;Decreased step length - right;Decreased weight shift to left;Left hip hike;Lateral trunk lean to right    Gait velocity  slightly decreased                Objective measurements completed on examination: See above findings.              PT Education - 02/21/18 1617    Education Details  prognosis, POC, HEP    Person(s) Educated  Patient    Methods  Explanation;Verbal cues;Demonstration;Tactile cues;Handout    Comprehension  Verbalized understanding;Returned demonstration       PT Short Term Goals - 02/21/18 1637      PT SHORT TERM GOAL #1   Title  Patient to be independnent with initial HEP.    Time  4    Period  Weeks    Status  New    Target Date  04/18/18        PT Long Term Goals - 02/21/18 1637      PT LONG TERM GOAL #1   Title  Patient to be independent with advanced HEP.    Time  8    Period  Weeks    Status  New    Target Date  04/18/18      PT LONG TERM GOAL #2   Title  Patient to demonstrate L ankle AROM/PROM [facility] and without pain limiting.     Time  8    Period  Weeks    Status  New    Target Date  04/18/18      PT LONG TERM GOAL #3   Title  Patient to demonstrate >=4+/5 strength in B LEs.    Time  8    Period  Weeks    Status  New    Target Date  04/18/18      PT LONG TERM GOAL #4   Title  Patient to report tolerance of 4 hours of standing/walking in tennis shoe without pain limiting.     Time  8    Period  Weeks    Status  New    Target Date  04/18/18      PT LONG TERM GOAL #5   Title  Patient to report full day of work without pain or edema limiting.              Plan - 02/21/18 1628    Clinical Impression  Statement  Patient is a pleasant 62y/o F presenting to OPPT with c/o L ankle pain and edema s/p L closed nondisplaced fx of lateral malleolus after a fall on 11/18/17. Has been walking in a cam boot for 10 weeks now and has been given ASO brace. Has been on blood thinners for 8 weeks for current DVT in L  gastroc and posterior tibialis. 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 Limited in prolonged standing, walking, reporting stiffness, and Increased sensitivity to lateral malleolus. Patient today with limited L ankle AROM/PROM - pain with inversion, decreased L ankle strength, gait deviations, TTP along L Achilles tendon, and mild edema along medial and lateral malleolus, dorsum of foot, along distal anterior tibialis. No tenderness, redness, or warmth along L posterior calf. Educated on gentle strengthening and stretching HEP and received handout. Patient reported understanding. Received ice pack to L ankle at end of session for edema. Would benefit from skilled PT services 2x/week for 8 weeks to address aforementioned impairments.     Clinical Presentation  Stable    Clinical Decision Making  Low    Rehab Potential  Good    Clinical Impairments Affecting Rehab Potential  GBS, current DVT in L calf    PT Frequency  2x / week    PT Duration  8 weeks    PT Treatment/Interventions  ADLs/Self Care Home Management;Cryotherapy;Functional mobility training;Stair training;Gait training;DME Instruction;Moist Heat;Therapeutic activities;Therapeutic exercise;Balance training;Neuromuscular re-education;Patient/family education;Orthotic Fit/Training;Passive range of motion;Manual techniques;Dry needling;Energy conservation;Splinting;Taping    PT Next Visit Plan  reassess HEP    Consulted and Agree with Plan of Care  Patient       Patient will benefit from skilled therapeutic intervention in order to improve the following  deficits and impairments:  Decreased range of motion, Difficulty walking, Decreased activity tolerance, Pain, Decreased balance, Hypomobility, Impaired flexibility, Increased edema, Decreased strength  Visit Diagnosis: Pain in left ankle and joints of left foot  Stiffness of left ankle, not elsewhere classified  Muscle weakness (generalized)  Difficulty in walking, not elsewhere classified  Localized edema     Problem List There are no active problems to display for this patient.   Gabriella Campbell, PT, DPT 02/21/18 4:43 PM   [facility] [place] [address]  [address] [place], [place], [address] Phone: [phone]   Fax:  [phone]  Name: Gabriella Campbell MRN: [id number] Date of Birth: 11/11/1955

## 2018-02-23 ENCOUNTER — Telehealth: Payer: Self-pay | Admitting: Internal Medicine

## 2018-02-23 DIAGNOSIS — I824Y9 Acute embolism and thrombosis of unspecified deep veins of unspecified proximal lower extremity: Secondary | ICD-10-CM

## 2018-02-23 NOTE — Telephone Encounter (Signed)
Spoke with pt and advised her that the orders for the labs and doppler are in. Pt understood and nothing further is needed.

## 2018-02-23 NOTE — Telephone Encounter (Addendum)
Gabriella Campbell - texted me saying now on 8 weeks of eliquis for below knee DVT and having lot of diarrhea.   Plan  - repeat duplex LE just on left side to do serial monitoring of her LLE DVT; can be done next week (last mid dec 2019)  - check d-dimer (last mid nov 2019) - to eval risk for progression/recurrence  - she also wants blood bmet, and lft - so please do this too  - will be in tough over phone with results   - continue eliquis   Thanks   MR  PS - sending to triage for action 02/23/2018    SIGNATURE    Dr. Kalman Shan, M.D., F.C.C.P,  Pulmonary and Critical Care Medicine Staff Physician, [facility] Center Director - Interstitial Lung Disease  Program  Pulmonary Fibrosis [facility] Network at [facility] [place], [place], [address]  Pager: [phone], If no answer or between  15:00h - 7:00h: call [phone] Telephone: [phone]  11:50 AM 02/23/2018

## 2018-02-26 ENCOUNTER — Encounter: Payer: Self-pay | Admitting: Physical Therapy

## 2018-02-26 ENCOUNTER — Ambulatory Visit: Payer: BLUE CROSS/BLUE SHIELD | Admitting: Physical Therapy

## 2018-02-26 DIAGNOSIS — M6281 Muscle weakness (generalized): Secondary | ICD-10-CM

## 2018-02-26 DIAGNOSIS — M25572 Pain in left ankle and joints of left foot: Secondary | ICD-10-CM | POA: Diagnosis not present

## 2018-02-26 DIAGNOSIS — M25672 Stiffness of left ankle, not elsewhere classified: Secondary | ICD-10-CM

## 2018-02-26 DIAGNOSIS — R600 Localized edema: Secondary | ICD-10-CM

## 2018-02-26 DIAGNOSIS — R262 Difficulty in walking, not elsewhere classified: Secondary | ICD-10-CM

## 2018-02-26 NOTE — Therapy (Signed)
[facility] [place] [address]  [address] [place], [place], [address] Phone: [phone]   Fax:  [phone]  Physical Therapy Treatment  Patient Details  Name: Gabriella Campbell MRN: [id number] Date of Birth: 01/11/1956 Referring Provider (PT): Christopher Blackman, MD   Encounter Date: 02/26/2018  PT End of Session - 02/26/18 1813    Visit Number  2    Number of Visits  17    Date for PT Re-Evaluation  04/18/18    Authorization Type  BCBS    PT Start Time  1531    PT Stop Time  1624    PT Time Calculation (min)  53 min    Equipment Utilized During Treatment  --   R cam boot   Activity Tolerance  Patient tolerated treatment well;Patient limited by pain    Behavior During Therapy  WFL for tasks assessed/performed       Past Medical History:  Diagnosis Date  . Guillain Barr syndrome (HCC)     History reviewed. No pertinent surgical history.  There were no vitals filed for this visit.  Subjective Assessment - 02/26/18 1533    Subjective  Reports that she forgot to bring her other shoe- still wearing cam boot today. Reports she is having trouble with inversion/eversion exercise.     Pertinent History  GBS    Diagnostic tests  02/15/18 L ankle xray: 3 views of the left ankle show a healed Weber a lateral malleolus fracture. The ankle mortise is intact. The soft tissue swelling is decreased when compared to previous injury films from October 2019.     Patient Stated Goals  100% recovery, full motion, being able to be on feet all day    Currently in Pain?  Yes    Pain Score  6     Pain Location  Ankle    Pain Orientation  Right;Lateral;Anterior    Pain Descriptors / Indicators  Dull    Pain Type  Chronic pain                       [facility] Adult PT Treatment/Exercise - 02/26/18 0001      Exercises   Exercises  Ankle      Modalities   Modalities  Cryotherapy      Cryotherapy   Number Minutes Cryotherapy  10  Minutes    Cryotherapy Location  Ankle   L   Type of Cryotherapy  Ice pack      Manual Therapy   Manual Therapy  Soft tissue mobilization;Passive ROM    Soft tissue mobilization  STM to L anterior tib, gentle retrograde massage to dorsum of foot and lateral ankle   avoiding posterior tib and gastroc d/t DVT   Passive ROM  L ankle PROM into DF, PF, INV, EV 3x30" each      Ankle Exercises: Stretches   Soleus Stretch  30 seconds;2 reps    Soleus Stretch Limitations  long sitting with strap    Gastroc Stretch  2 reps;30 seconds    Gastroc Stretch Limitations  long sitting with strap      Ankle Exercises: Aerobic   Nustep  L1 x 6 min UEs/LEs      Ankle Exercises: Seated   Other Seated Ankle Exercises  4 way ankle L LE 20x each way; PF red, DF, INV, EV with yellow TB    Other Seated Ankle Exercises  L ankle inversion & eversion isometric against   ball 10x5"             PT Education - 02/26/18 1812    Education Details  update to HEP    Person(s) Educated  Patient    Methods  Explanation;Demonstration;Tactile cues;Verbal cues;Handout    Comprehension  Verbalized understanding;Returned demonstration       PT Short Term Goals - 02/26/18 1819      PT SHORT TERM GOAL #1   Title  Patient to be independnent with initial HEP.    Time  4    Period  Weeks    Status  Partially Met        PT Long Term Goals - 02/26/18 1819      PT LONG TERM GOAL #1   Title  Patient to be independent with advanced HEP.    Time  8    Period  Weeks    Status  On-going      PT LONG TERM GOAL #2   Title  Patient to demonstrate L ankle AROM/PROM [facility] and without pain limiting.     Time  8    Period  Weeks    Status  On-going      PT LONG TERM GOAL #3   Title  Patient to demonstrate >=4+/5 strength in B LEs.    Time  8    Period  Weeks    Status  On-going      PT LONG TERM GOAL #4   Title  Patient to report tolerance of 4 hours of standing/walking in tennis shoe without pain limiting.      Time  8    Period  Weeks    Status  On-going      PT LONG TERM GOAL #5   Title  Patient to report full day of work without pain or edema limiting.     Status  On-going            Plan - 02/26/18 1813    Clinical Impression Statement  Patient arrived to session with report forgetting shoe for L foot, ambulating into clinic with cam boot. Reports still having difficulty with resisted ankle inversion/eversion. Addressed L lateral ankle and dorsal foot edema with gentle retrograde massage. Patient tolerated gentle STM to anterior tibialis with patient reporting mild discomfort. Careful to avoid posterior tib and gastroc with STM d/t DVT. Worked on L ankle strengthening with patient showing considerable hip compensation with inversion and eversion. Better tolerance for isometric inversion and eversion. Updated HEP with these exercises. Ended session with ice pack to L ankle. No complaints at end of session.     PT Treatment/Interventions  ADLs/Self Care Home Management;Cryotherapy;Functional mobility training;Stair training;Gait training;DME Instruction;Moist Heat;Therapeutic activities;Therapeutic exercise;Balance training;Neuromuscular re-education;Patient/family education;Orthotic Fit/Training;Passive range of motion;Manual techniques;Dry needling;Energy conservation;Splinting;Taping    PT Next Visit Plan  progress L ankle strengthening    Consulted and Agree with Plan of Care  Patient       Patient will benefit from skilled therapeutic intervention in order to improve the following deficits and impairments:  Decreased range of motion, Difficulty walking, Decreased activity tolerance, Pain, Decreased balance, Hypomobility, Impaired flexibility, Increased edema, Decreased strength  Visit Diagnosis: Pain in left ankle and joints of left foot  Stiffness of left ankle, not elsewhere classified  Muscle weakness (generalized)  Difficulty in walking, not elsewhere classified  Localized  edema     Problem List There are no active problems to display for this patient.   [name], PT, DPT 02/26/18 6:21  PM   [facility] [address]  [place], [place], [address] Phone: [phone]   Fax:  [phone]  Name: Sammantha Mehlhaff MRN: [id number] Date of Birth: 12-14-1955

## 2018-02-27 ENCOUNTER — Other Ambulatory Visit (INDEPENDENT_AMBULATORY_CARE_PROVIDER_SITE_OTHER): Payer: BLUE CROSS/BLUE SHIELD

## 2018-02-27 DIAGNOSIS — I824Y9 Acute embolism and thrombosis of unspecified deep veins of unspecified proximal lower extremity: Secondary | ICD-10-CM | POA: Diagnosis not present

## 2018-02-27 LAB — HEPATIC FUNCTION PANEL
ALBUMIN: 4 g/dL (ref 3.5–5.2)
ALK PHOS: 70 U/L (ref 39–117)
ALT: 6 U/L (ref 0–35)
AST: 16 U/L (ref 0–37)
Bilirubin, Direct: 0 mg/dL (ref 0.0–0.3)
TOTAL PROTEIN: 6.6 g/dL (ref 6.0–8.3)
Total Bilirubin: 0.7 mg/dL (ref 0.2–1.2)

## 2018-02-27 LAB — BASIC METABOLIC PANEL
BUN: 13 mg/dL (ref 6–23)
CALCIUM: 9.1 mg/dL (ref 8.4–10.5)
CO2: 31 meq/L (ref 19–32)
Chloride: 104 mEq/L (ref 96–112)
Creatinine, Ser: 0.73 mg/dL (ref 0.40–1.20)
GFR: 81.08 mL/min (ref >60.00)
GLUCOSE: 91 mg/dL (ref 70–99)
Potassium: 4.2 mEq/L (ref 3.5–5.1)
SODIUM: 140 meq/L (ref 135–145)

## 2018-02-28 ENCOUNTER — Encounter (HOSPITAL_COMMUNITY): Payer: BLUE CROSS/BLUE SHIELD

## 2018-02-28 ENCOUNTER — Telehealth: Payer: Self-pay | Admitting: Internal Medicine

## 2018-02-28 LAB — D-DIMER, QUANTITATIVE (NOT AT ARMC): D DIMER QUANT: 0.3 ug{FEU}/mL (ref <0.50)

## 2018-02-28 NOTE — Telephone Encounter (Signed)
Please let Dr Amado CoeFein know that d-dimer and rest of blood workis normal  Plan  - we can wait for duplex LE on 03/07/2018 and decide about dc apixaban unless she wants to stop now

## 2018-03-01 ENCOUNTER — Ambulatory Visit: Payer: BLUE CROSS/BLUE SHIELD

## 2018-03-01 DIAGNOSIS — M25672 Stiffness of left ankle, not elsewhere classified: Secondary | ICD-10-CM

## 2018-03-01 DIAGNOSIS — R600 Localized edema: Secondary | ICD-10-CM

## 2018-03-01 DIAGNOSIS — M6281 Muscle weakness (generalized): Secondary | ICD-10-CM

## 2018-03-01 DIAGNOSIS — M25572 Pain in left ankle and joints of left foot: Secondary | ICD-10-CM

## 2018-03-01 DIAGNOSIS — R262 Difficulty in walking, not elsewhere classified: Secondary | ICD-10-CM

## 2018-03-01 NOTE — Therapy (Signed)
[place] [address]  [place], [place], [address] Phone: [phone]   Fax:  [phone]  Physical Therapy Treatment  Patient Details  Name: Gabriella Campbell MRN: [id number] Date of Birth: 1955-04-14 Referring Provider (PT): Jean Rosenthal, MD   Encounter Date: 03/01/2018  PT End of Session - 03/01/18 1604    Visit Number  3    Number of Visits  17    Date for PT Re-Evaluation  04/18/18    Authorization Type  BCBS    PT Start Time  3888    PT Stop Time  1630   ended with 10 min moist heat to L ankle    PT Time Calculation (min)  55 min    Equipment Utilized During Treatment  --   R cam boot   Activity Tolerance  Patient tolerated treatment well;Patient limited by pain    Behavior During Therapy  [facility] for tasks assessed/performed       Past Medical History:  Diagnosis Date  . Guillain Barr syndrome ([place])     No past surgical history on file.  There were no vitals filed for this visit.  Subjective Assessment - 03/01/18 1543    Subjective  Pt. reporting she is not having any issues with HEP.      Pertinent History  GBS    Diagnostic tests  02/15/18 L ankle xray: 3 views of the left ankle show a healed Weber a lateral malleolus fracture. The ankle mortise is intact. The soft tissue swelling is decreased when compared to previous injury films from October 2019.     Patient Stated Goals  100% recovery, full motion, being able to be on feet all day    Currently in Pain?  Yes    Pain Score  7     Pain Location  Ankle    Pain Orientation  Right    Pain Descriptors / Indicators  Aching    Multiple Pain Sites  No                       [facility] Adult PT Treatment/Exercise - 03/01/18 0001      Cryotherapy   Number Minutes Cryotherapy  10 Minutes    Cryotherapy Location  Ankle   L   Type of Cryotherapy  Ice pack      Manual Therapy   Manual Therapy  Soft tissue mobilization;Passive ROM     Soft tissue mobilization  STM to L anterior tib, gentle retrograde massage to dorsum of foot and lateral ankle   avoided gastroc and L posterior tib due to DVT   Passive ROM  L ankle PROM into DF, PF, INV, EV 3x30" each      Ankle Exercises: Aerobic   Nustep  L1 x 6 min UEs/LEs      Ankle Exercises: Seated   Other Seated Ankle Exercises  4 way ankle L LE 20x each way; PF red, DF, INV, EV with yellow TB      Ankle Exercises: Stretches   Soleus Stretch  30 seconds;2 reps    Soleus Stretch Limitations  long sitting with strap    Gastroc Stretch  2 reps;30 seconds   Cues required for appropriate stretch    Gastroc Stretch Limitations  long sitting with strap    Other Stretch  standing L gastroc and soleus stretch leaning into wall x 30 sec    cues required for gentle stretch  Ankle Exercises: Standing   Other Standing Ankle Exercises  Standing weight shift at counter R/Lx 10 reps              PT Education - 03/01/18 1636    Education Details  HEP update    Person(s) Educated  Patient    Methods  Explanation;Demonstration;Verbal cues;Handout    Comprehension  Verbalized understanding;Returned demonstration;Verbal cues required;Need further instruction       PT Short Term Goals - 02/26/18 1819      PT SHORT TERM GOAL #1   Title  Patient to be independnent with initial HEP.    Time  4    Period  Weeks    Status  Partially Met        PT Long Term Goals - 02/26/18 1819      PT LONG TERM GOAL #1   Title  Patient to be independent with advanced HEP.    Time  8    Period  Weeks    Status  On-going      PT LONG TERM GOAL #2   Title  Patient to demonstrate L ankle AROM/PROM [facility] and without pain limiting.     Time  8    Period  Weeks    Status  On-going      PT LONG TERM GOAL #3   Title  Patient to demonstrate >=4+/5 strength in B LEs.    Time  8    Period  Weeks    Status  On-going      PT LONG TERM GOAL #4   Title  Patient to report tolerance of 4 hours  of standing/walking in tennis shoe without pain limiting.     Time  8    Period  Weeks    Status  On-going      PT LONG TERM GOAL #5   Title  Patient to report full day of work without pain or edema limiting.     Status  On-going            Plan - 03/01/18 1610    Clinical Impression Statement  Pt. reporting no issues with HEP however review revealed pt. requiring some cueing with calf stretch for proper pull to feel appropriate stretch and pt. only holding ~ 10 sec.  Pt. able to demo good HEP technique following cueing.  Required cueing with ankle TB exercises for proper pacing.  No issue with standing wt. shift and wall calf stretch today.  Pt. reports some L ankle/foot pain increased today however admits to weaning from CAM boot completely.  Pt. encouraged to slowly wean from CAM boot as to reduce chance of injury.  Ended visit with ice pack to L ankle/foot to reduce post-exercise swelling and pain.      Clinical Impairments Affecting Rehab Potential  GBS, current DVT in L calf    PT Frequency  2x / week    PT Duration  8 weeks    PT Treatment/Interventions  ADLs/Self Care Home Management;Cryotherapy;Functional mobility training;Stair training;Gait training;DME Instruction;Moist Heat;Therapeutic activities;Therapeutic exercise;Balance training;Neuromuscular re-education;Patient/family education;Orthotic Fit/Training;Passive range of motion;Manual techniques;Dry needling;Energy conservation;Splinting;Taping    PT Next Visit Plan  progress L ankle strengthening    Consulted and Agree with Plan of Care  Patient       Patient will benefit from skilled therapeutic intervention in order to improve the following deficits and impairments:  Decreased range of motion, Difficulty walking, Decreased activity tolerance, Pain, Decreased balance, Hypomobility, Impaired flexibility, Increased edema, Decreased  strength  Visit Diagnosis: Pain in left ankle and joints of left foot  Stiffness of left  ankle, not elsewhere classified  Muscle weakness (generalized)  Difficulty in walking, not elsewhere classified  Localized edema     Problem List There are no active problems to display for this patient.   Bess Harvest, PTA 03/01/18 6:22 PM   [place] [address]  [place], [place], [address] Phone: [phone]   Fax:  [phone]  Name: Gabriella Campbell MRN: [id number] Date of Birth: [birth year]

## 2018-03-01 NOTE — Telephone Encounter (Signed)
Called and spoke with pt letting her know the results of the labwork. Stated to pt once we have the results of the duplex, we would call her to let her know the results. Pt expressed understanding. Nothing further needed.

## 2018-03-05 ENCOUNTER — Ambulatory Visit: Payer: BLUE CROSS/BLUE SHIELD

## 2018-03-05 DIAGNOSIS — M25572 Pain in left ankle and joints of left foot: Secondary | ICD-10-CM

## 2018-03-05 DIAGNOSIS — M25672 Stiffness of left ankle, not elsewhere classified: Secondary | ICD-10-CM

## 2018-03-05 DIAGNOSIS — R600 Localized edema: Secondary | ICD-10-CM

## 2018-03-05 DIAGNOSIS — M6281 Muscle weakness (generalized): Secondary | ICD-10-CM

## 2018-03-05 DIAGNOSIS — R262 Difficulty in walking, not elsewhere classified: Secondary | ICD-10-CM

## 2018-03-05 NOTE — Therapy (Signed)
[facility] [place] [address]  [address] [place], [place], [address] Phone: [phone]   Fax:  [phone]  Physical Therapy Treatment  Patient Details  Name: Gabriella Campbell MRN: [id number] Date of Birth: 05/03/1955 Referring Provider (PT): Doneen Poissonhristopher Blackman, MD   Encounter Date: 03/05/2018  PT End of Session - 03/05/18 1541    Visit Number  4    Number of Visits  17    Date for PT Re-Evaluation  04/18/18    Authorization Type  BCBS    PT Start Time  1536    PT Stop Time  1620   session ran late due to requested taping to LE by pt.    PT Time Calculation (min)  44 min    Equipment Utilized During Treatment  --   R cam boot   Activity Tolerance  Patient tolerated treatment well;Patient limited by pain    Behavior During Therapy  WFL for tasks assessed/performed       Past Medical History:  Diagnosis Date  . Guillain Barr syndrome [facility](HCC)     History reviewed. No pertinent surgical history.  There were no vitals filed for this visit.  Subjective Assessment - 03/05/18 1540    Subjective  Has been standing and walking more over weekend with 14 hour work shifts.      Pertinent History  GBS    Diagnostic tests  02/15/18 L ankle xray: 3 views of the left ankle show a healed Weber a lateral malleolus fracture. The ankle mortise is intact. The soft tissue swelling is decreased when compared to previous injury films from October 2019.     Patient Stated Goals  100% recovery, full motion, being able to be on feet all day    Currently in Pain?  Yes    Pain Score  4    up to 8/10 at worst   Pain Location  Ankle    Pain Orientation  Left    Pain Descriptors / Indicators  Aching    Pain Type  Chronic pain    Multiple Pain Sites  No         [facility] PT Assessment - 03/05/18 1558      AROM   Right/Left Ankle  Left    Left Ankle Dorsiflexion  9                   [facility] Adult PT Treatment/Exercise - 03/05/18 1547       Manual Therapy   Manual Therapy  Taping    Kinesiotex  Edema      Kinesiotix   Edema  L anterior shin edema taping pattern       Ankle Exercises: Aerobic   Recumbent Bike  Lvl 1, 6 min       Ankle Exercises: Supine   Other Supine Ankle Exercises  Alternating DF march with yellow looped TB at forefoot x 15 reps    with feet resting on peanut p-ball    Other Supine Ankle Exercises  L ankle EV, IR with red TB x 10 reps       Ankle Exercises: Stretches   Soleus Stretch  1 rep;30 seconds    Soleus Stretch Limitations  strap     Gastroc Stretch  1 rep;30 seconds    Gastroc Stretch Limitations  strap       Ankle Exercises: Seated   BAPS  Level 2;Sitting;10 reps    BAPS Weights (lbs)  2.5  BAPS Limitations  R/L, front/back, CW, CCW                PT Short Term Goals - 03/05/18 1703      PT SHORT TERM GOAL #1   Title  Patient to be independnent with initial HEP.    Time  4    Period  Weeks    Status  Achieved        PT Long Term Goals - 02/26/18 1819      PT LONG TERM GOAL #1   Title  Patient to be independent with advanced HEP.    Time  8    Period  Weeks    Status  On-going      PT LONG TERM GOAL #2   Title  Patient to demonstrate L ankle AROM/PROM [facility] and without pain limiting.     Time  8    Period  Weeks    Status  On-going      PT LONG TERM GOAL #3   Title  Patient to demonstrate >=4+/5 strength in B LEs.    Time  8    Period  Weeks    Status  On-going      PT LONG TERM GOAL #4   Title  Patient to report tolerance of 4 hours of standing/walking in tennis shoe without pain limiting.     Time  8    Period  Weeks    Status  On-going      PT LONG TERM GOAL #5   Title  Patient to report full day of work without pain or edema limiting.     Status  On-going            Plan - 03/05/18 1600    Clinical Impression Statement  Gabriella Campbell reporting she had increased L LE pain which started Friday and continued through weekend.  Pt. admitting  that she is also on her feet increased time throughout day as she is working 14-hour shift at work and is in the middle of 26 days straight of work.  Tolerated all supine and sitting strengthening and ROM therex well today.  Did require cueing for proper pacing with therex today.  L ankle DF ROM progressed from -5 > 9 dg DF today progressing well toward LTG #2.  Ended visit with edema taping pattern applied to L anterior shin per pt. request to end for hopeful reduction in swelling.  Will monitor response to taping and continue to progress per pt. in coming visits.      Rehab Potential  Good    Clinical Impairments Affecting Rehab Potential  GBS, current DVT in L calf    PT Frequency  2x / week    PT Duration  8 weeks    PT Treatment/Interventions  ADLs/Self Care Home Management;Cryotherapy;Functional mobility training;Stair training;Gait training;DME Instruction;Moist Heat;Therapeutic activities;Therapeutic exercise;Balance training;Neuromuscular re-education;Patient/family education;Orthotic Fit/Training;Passive range of motion;Manual techniques;Dry needling;Energy conservation;Splinting;Taping    PT Next Visit Plan  progress L ankle strengthening    Consulted and Agree with Plan of Care  Patient       Patient will benefit from skilled therapeutic intervention in order to improve the following deficits and impairments:  Decreased range of motion, Difficulty walking, Decreased activity tolerance, Pain, Decreased balance, Hypomobility, Impaired flexibility, Increased edema, Decreased strength  Visit Diagnosis: Pain in left ankle and joints of left foot  Stiffness of left ankle, not elsewhere classified  Muscle weakness (generalized)  Difficulty in walking, not elsewhere classified  Localized edema     Problem List There are no active problems to display for this patient.   Kermit BaloMicah Bentleigh Waren, PTA 03/05/18 5:07 PM   [facility] [place] [address]  [address] [place], [place], [address] Phone: [phone]   Fax:  [phone]  Name: Gabriella Campbell MRN: [id number] Date of Birth: 11/01/1955

## 2018-03-07 ENCOUNTER — Encounter: Payer: Self-pay | Admitting: Physical Therapy

## 2018-03-07 ENCOUNTER — Other Ambulatory Visit: Payer: Self-pay | Admitting: *Deleted

## 2018-03-07 ENCOUNTER — Ambulatory Visit (HOSPITAL_COMMUNITY)
Admission: RE | Admit: 2018-03-07 | Discharge: 2018-03-07 | Disposition: A | Discharge status: Home / Self Care | Payer: BLUE CROSS/BLUE SHIELD | Source: Ambulatory Visit | Attending: Cardiovascular Disease | Admitting: Cardiovascular Disease

## 2018-03-07 ENCOUNTER — Ambulatory Visit: Payer: BLUE CROSS/BLUE SHIELD | Admitting: Physical Therapy

## 2018-03-07 DIAGNOSIS — I824Y9 Acute embolism and thrombosis of unspecified deep veins of unspecified proximal lower extremity: Secondary | ICD-10-CM

## 2018-03-07 DIAGNOSIS — I824Y2 Acute embolism and thrombosis of unspecified deep veins of left proximal lower extremity: Secondary | ICD-10-CM | POA: Diagnosis not present

## 2018-03-07 DIAGNOSIS — M25572 Pain in left ankle and joints of left foot: Secondary | ICD-10-CM

## 2018-03-07 DIAGNOSIS — M6281 Muscle weakness (generalized): Secondary | ICD-10-CM

## 2018-03-07 DIAGNOSIS — R262 Difficulty in walking, not elsewhere classified: Secondary | ICD-10-CM

## 2018-03-07 DIAGNOSIS — R600 Localized edema: Secondary | ICD-10-CM

## 2018-03-07 DIAGNOSIS — M25672 Stiffness of left ankle, not elsewhere classified: Secondary | ICD-10-CM

## 2018-03-07 NOTE — Therapy (Signed)
[facility] Outpatient Rehabilitation [facility] [address]  [address] [place], [place], [address] Phone: [phone]   Fax:  [phone]  Physical Therapy Treatment  Patient Details  Name: Gabriella Campbell MRN: [id number] Date of Birth: [birth year] Referring Provider (PT): Doneen Poisson, MD   Encounter Date: 03/07/2018  PT End of Session - 03/07/18 1623    Visit Number  5    Number of Visits  17    Date for PT Re-Evaluation  04/18/18    Authorization Type  BCBS    PT Start Time  1536    PT Stop Time  1618    PT Time Calculation (min)  42 min    Equipment Utilized During Treatment  --   R cam boot   Activity Tolerance  Patient tolerated treatment well;Patient limited by pain    Behavior During Therapy  [facility] for tasks assessed/performed       Past Medical History:  Diagnosis Date  . Guillain Barr syndrome [facility])     History reviewed. No pertinent surgical history.  There were no vitals filed for this visit.  Subjective Assessment - 03/07/18 1538    Subjective  Noting decrease in edema from taping. Pain levels have been variable- depending on what actvity she does.     Pertinent History  GBS    Diagnostic tests  02/15/18 L ankle xray: 3 views of the left ankle show a healed Weber a lateral malleolus fracture. The ankle mortise is intact. The soft tissue swelling is decreased when compared to previous injury films from October 2019.     Patient Stated Goals  100% recovery, full motion, being able to be on feet all day    Currently in Pain?  Yes    Pain Score  6     Pain Orientation  Left    Pain Descriptors / Indicators  Aching    Pain Type  Chronic pain                       [facility] Adult PT Treatment/Exercise - 03/07/18 0001      Kinesiotix   Edema  L anterior shin edema taping pattern with weave      Ankle Exercises: Aerobic   Nustep  L2 x 6 min UEs/LEs      Ankle Exercises: Stretches   Other Stretch  L DF stretch at  counter top in 3 directions 5x5"   to tolerance     Ankle Exercises: Standing   SLS  L SLS on foam with CGA and manual guarding at lateral foot 5x20"    Balance Master: Limits for Stability  weight shifts to L on foam pad with B UE support at counter 10x5"    Heel Raises  15 reps;Both   2x15 at counter top; unable to tolerate ecentric raise   Side Shuffle (Round Trip)  lateral step ups on 4" step with B counter support on L x10   cues for upright posture and slower speed   Other Standing Ankle Exercises  mini squat at counter top x15    Other Standing Ankle Exercises  anterior step up + down with L LE on 4" step with 1 UE support   heavy UE reliance              PT Short Term Goals - 03/05/18 1703      PT SHORT TERM GOAL #1   Title  Patient to be independnent with initial  HEP.    Time  4    Period  Weeks    Status  Achieved        PT Long Term Goals - 02/26/18 1819      PT LONG TERM GOAL #1   Title  Patient to be independent with advanced HEP.    Time  8    Period  Weeks    Status  On-going      PT LONG TERM GOAL #2   Title  Patient to demonstrate L ankle AROM/PROM [facility] and without pain limiting.     Time  8    Period  Weeks    Status  On-going      PT LONG TERM GOAL #3   Title  Patient to demonstrate >=4+/5 strength in B LEs.    Time  8    Period  Weeks    Status  On-going      PT LONG TERM GOAL #4   Title  Patient to report tolerance of 4 hours of standing/walking in tennis shoe without pain limiting.     Time  8    Period  Weeks    Status  On-going      PT LONG TERM GOAL #5   Title  Patient to report full day of work without pain or edema limiting.     Status  On-going            Plan - 03/07/18 1624    Clinical Impression Statement  Patient arrived to session with report of good benefit from taping. Worked on DF stretching to tolerance with patient showing good DF ROM. Introduced eccentric heel raises, with patient showing heavy reliance eon  UEs- this exercise was discontinued. Better tolerance of B LE heel raise. Introduced anterior and lateral step ups on 4" step as patient reports difficulty with stairs. Cues given to promote upright trunk and control eccentric lowering. Introduced SLS on foam with manual guarding at lateral ankle to avoid falling into inversion. Ended session with taping to L anterior tibialis for edema. No complaints at end of session. Patient did report resolution of posterior tib DVT and reduction in size of gastroc DVT as she saw her MD today.     Clinical Impairments Affecting Rehab Potential  GBS, current DVT in L calf    PT Treatment/Interventions  ADLs/Self Care Home Management;Cryotherapy;Functional mobility training;Stair training;Gait training;DME Instruction;Moist Heat;Therapeutic activities;Therapeutic exercise;Balance training;Neuromuscular re-education;Patient/family education;Orthotic Fit/Training;Passive range of motion;Manual techniques;Dry needling;Energy conservation;Splinting;Taping    PT Next Visit Plan  progress L ankle strengthening    Consulted and Agree with Plan of Care  Patient       Patient will benefit from skilled therapeutic intervention in order to improve the following deficits and impairments:  Decreased range of motion, Difficulty walking, Decreased activity tolerance, Pain, Decreased balance, Hypomobility, Impaired flexibility, Increased edema, Decreased strength  Visit Diagnosis: Pain in left ankle and joints of left foot  Stiffness of left ankle, not elsewhere classified  Muscle weakness (generalized)  Difficulty in walking, not elsewhere classified  Localized edema     Problem List There are no active problems to display for this patient.   Anette Guarneri, PT, DPT 03/07/18 4:37 PM   [facility] [address]  [address] [place], [place], [address] Phone: [phone]   Fax:  [phone]  Name: Jahmiya Mclain MRN: [id number] Date of Birth: 1955-08-13

## 2018-03-08 ENCOUNTER — Telehealth: Payer: Self-pay | Admitting: Internal Medicine

## 2018-03-08 NOTE — Telephone Encounter (Signed)
Called and spoke with pt about the results of the doppler study. Pt expressed understanding. Pt stated she is still taking Eliquis and wanted to know if the doppler could be repeated in 3 weeks.   Checked with MR and he said it would be fine for pt to have it repeated in 3 weeks. Pt expressed understanding. Order was placed for her to have doppler in 3 weeks and stated to pt we would call her with results after that. Nothing further needed.

## 2018-03-09 ENCOUNTER — Other Ambulatory Visit: Payer: Self-pay | Admitting: Internal Medicine

## 2018-03-12 ENCOUNTER — Ambulatory Visit: Payer: BLUE CROSS/BLUE SHIELD | Attending: Orthopaedic Surgery | Admitting: Physical Therapy

## 2018-03-12 ENCOUNTER — Encounter: Payer: Self-pay | Admitting: Physical Therapy

## 2018-03-12 DIAGNOSIS — R262 Difficulty in walking, not elsewhere classified: Secondary | ICD-10-CM

## 2018-03-12 DIAGNOSIS — M25672 Stiffness of left ankle, not elsewhere classified: Secondary | ICD-10-CM | POA: Insufficient documentation

## 2018-03-12 DIAGNOSIS — R600 Localized edema: Secondary | ICD-10-CM | POA: Diagnosis present

## 2018-03-12 DIAGNOSIS — M6281 Muscle weakness (generalized): Secondary | ICD-10-CM

## 2018-03-12 DIAGNOSIS — M25572 Pain in left ankle and joints of left foot: Secondary | ICD-10-CM

## 2018-03-12 NOTE — Therapy (Signed)
[facility] Outpatient Rehabilitation [facility] [address]  [address] [place], [place], [address] Phone: [phone]   Fax:  [phone]  Physical Therapy Treatment  Patient Details  Name: Gabriella Campbell MRN: [id number] Date of Birth: 04/27/1955 Referring Provider (PT): Doneen Poisson, MD   Encounter Date: 03/12/2018  PT End of Session - 03/12/18 1622    Visit Number  6    Number of Visits  17    Date for PT Re-Evaluation  04/18/18    Authorization Type  BCBS    PT Start Time  1535    PT Stop Time  1613    PT Time Calculation (min)  38 min    Equipment Utilized During Treatment  --   R cam boot   Activity Tolerance  Patient tolerated treatment well    Behavior During Therapy  WFL for tasks assessed/performed       Past Medical History:  Diagnosis Date  . Guillain Barr syndrome [facility])     History reviewed. No pertinent surgical history.  There were no vitals filed for this visit.  Subjective Assessment - 03/12/18 1538    Subjective  Reports that L ankle is more stiff today. Also has issues with edema in proximal shin when she wears compression sock, normal sock, or ankle brace. Has been on her feet all day today.     Pertinent History  GBS    Diagnostic tests  02/15/18 L ankle xray: 3 views of the left ankle show a healed Weber a lateral malleolus fracture. The ankle mortise is intact. The soft tissue swelling is decreased when compared to previous injury films from October 2019.     Patient Stated Goals  100% recovery, full motion, being able to be on feet all day    Currently in Pain?  Yes    Pain Score  6     Pain Location  Ankle    Pain Orientation  Left    Pain Descriptors / Indicators  Aching    Pain Type  Chronic pain                       [facility] Adult PT Treatment/Exercise - 03/12/18 0001      Manual Therapy   Soft tissue mobilization  gente STM to L anterior tib and posterior tib, gentle retrograde massage to medial  and lateral ankle    Passive ROM  L ankle PROM into DF, PF, INV, EV 3x30" each   audible L ankle pop- patient denied pain     Ankle Exercises: Aerobic   Nustep  L2 x 6 min LEs only      Ankle Exercises: Standing   SLS  L SLS 3x30"; L SLS on foam 3x30"   intermittent UE support on treadmill rail   Heel Raises  Both;20 reps   at treadmill rail   Other Standing Ankle Exercises  mini squat at treadmill    Other Standing Ankle Exercises  B heel raise w/ 5# in each hand x10      Ankle Exercises: Seated   Heel Raises  Both;20 reps   5# over each knee            PT Education - 03/12/18 1621    Education Details  HEP update    Person(s) Educated  Patient    Methods  Explanation;Demonstration;Tactile cues;Verbal cues;Handout    Comprehension  Verbalized understanding;Returned demonstration       PT Short Term  Goals - 03/05/18 1703      PT SHORT TERM GOAL #1   Title  Patient to be independnent with initial HEP.    Time  4    Period  Weeks    Status  Achieved        PT Long Term Goals - 02/26/18 1819      PT LONG TERM GOAL #1   Title  Patient to be independent with advanced HEP.    Time  8    Period  Weeks    Status  On-going      PT LONG TERM GOAL #2   Title  Patient to demonstrate L ankle AROM/PROM [facility] and without pain limiting.     Time  8    Period  Weeks    Status  On-going      PT LONG TERM GOAL #3   Title  Patient to demonstrate >=4+/5 strength in B LEs.    Time  8    Period  Weeks    Status  On-going      PT LONG TERM GOAL #4   Title  Patient to report tolerance of 4 hours of standing/walking in tennis shoe without pain limiting.     Time  8    Period  Weeks    Status  On-going      PT LONG TERM GOAL #5   Title  Patient to report full day of work without pain or edema limiting.     Status  On-going            Plan - 03/12/18 1622    Clinical Impression Statement  Patient arrived to session with report of increased stiffness in L ankle  today. Also notes continuing issues with edema. Tolerated STM to L anterior tib and posterior tib as well as gentle retrograde massage to medial and lateral ankle. Good tolerance of L ankle PROM. Patient with audible pop of L ankle which she reported was nonpainful. Introduced weighted sitting heel raises with good tolerance. Able to perform L SLS with intermittent UE support and cues to engage later hip musculature. Updated HEP to include SLS and mini squat at counter top. Patient reported understanding and with no complaints at end of session.     Clinical Impairments Affecting Rehab Potential  GBS, current DVT in L calf    PT Treatment/Interventions  ADLs/Self Care Home Management;Cryotherapy;Functional mobility training;Stair training;Gait training;DME Instruction;Moist Heat;Therapeutic activities;Therapeutic exercise;Balance training;Neuromuscular re-education;Patient/family education;Orthotic Fit/Training;Passive range of motion;Manual techniques;Dry needling;Energy conservation;Splinting;Taping    PT Next Visit Plan  progress L ankle strengthening    Consulted and Agree with Plan of Care  Patient       Patient will benefit from skilled therapeutic intervention in order to improve the following deficits and impairments:  Decreased range of motion, Difficulty walking, Decreased activity tolerance, Pain, Decreased balance, Hypomobility, Impaired flexibility, Increased edema, Decreased strength  Visit Diagnosis: Pain in left ankle and joints of left foot  Stiffness of left ankle, not elsewhere classified  Muscle weakness (generalized)  Difficulty in walking, not elsewhere classified  Localized edema     Problem List There are no active problems to display for this patient.   Anette Guarneri, PT, DPT 03/12/18 6:13 PM   [facility] Outpatient Rehabilitation [facility] [address]  [address] [place], [place], [address] Phone: [phone]   Fax:   [phone]  Name: Kadiatou Cosson MRN: [id number] Date of Birth: [birth year]

## 2018-03-15 ENCOUNTER — Ambulatory Visit: Payer: BLUE CROSS/BLUE SHIELD

## 2018-03-15 DIAGNOSIS — M25572 Pain in left ankle and joints of left foot: Secondary | ICD-10-CM | POA: Diagnosis not present

## 2018-03-15 DIAGNOSIS — M25672 Stiffness of left ankle, not elsewhere classified: Secondary | ICD-10-CM

## 2018-03-15 DIAGNOSIS — M6281 Muscle weakness (generalized): Secondary | ICD-10-CM

## 2018-03-15 DIAGNOSIS — R262 Difficulty in walking, not elsewhere classified: Secondary | ICD-10-CM

## 2018-03-15 DIAGNOSIS — R600 Localized edema: Secondary | ICD-10-CM

## 2018-03-15 NOTE — Therapy (Signed)
[facility] [address]  [address] [place], [place], [address] Phone: [phone]   Fax:  [phone]  Physical Therapy Treatment  Patient Details  Name: Gabriella Campbell MRN: [id number] Date of Birth: 09/26/1955 Referring Provider (PT): Doneen Poissonhristopher Blackman, MD   Encounter Date: 03/15/2018  PT End of Session - 03/15/18 1624    Visit Number  7    Number of Visits  17    Date for PT Re-Evaluation  04/18/18    Authorization Type  BCBS    PT Start Time  1537    PT Stop Time  1624    PT Time Calculation (min)  47 min    Equipment Utilized During Treatment  --    Activity Tolerance  Patient tolerated treatment well    Behavior During Therapy  [facility] for tasks assessed/performed       Past Medical History:  Diagnosis Date  . Guillain Barr syndrome (HCC)     No past surgical history on file.  There were no vitals filed for this visit.  Subjective Assessment - 03/15/18 1545    Subjective  Pt. reporting some ache in ankle since last session.  Has been working everyday this month without day off and on feet for large amount of time each work day.      Pertinent History  GBS    Diagnostic tests  02/15/18 L ankle xray: 3 views of the left ankle show a healed Weber a lateral malleolus fracture. The ankle mortise is intact. The soft tissue swelling is decreased when compared to previous injury films from October 2019.     Patient Stated Goals  100% recovery, full motion, being able to be on feet all day    Currently in Pain?  Yes    Pain Score  4    up to 8/10 at worst.     Pain Location  Ankle    Pain Orientation  Left    Pain Descriptors / Indicators  Aching    Pain Type  Chronic pain    Multiple Pain Sites  No         [facility] PT Assessment - 03/15/18 0001      AROM   Right/Left Ankle  Left    Left Ankle Dorsiflexion  12    Left Ankle Plantar Flexion  49    Left Ankle Inversion  23    Left Ankle Eversion  10      PROM    Left Ankle Dorsiflexion  19    Left Ankle Plantar Flexion  55    Left Ankle Inversion  35    Left Ankle Eversion  18                   [facility] Adult PT Treatment/Exercise - 03/15/18 1551      Knee/Hip Exercises: Standing   Other Standing Knee Exercises  Alternating step and lean over BOSU ball (up) x 10  reps each LE no UE support with BOSU ball next to mat table leg     Other Standing Knee Exercises  Alternating hip flexion + DF march with red looped TB at forefoot x 10 reps each LE - well tolerated    light UE support     Ankle Exercises: Aerobic   Nustep  L4 x 7 min LEs only      Ankle Exercises: Standing   Heel Raises  Both;20 reps   at counte cues for slow eccentric  motion    Toe Raise  15 reps;3 seconds    Heel Walk (Round Trip)  x laps at counter     Other Standing Ankle Exercises  Alternating cone nock over/righting (difficulty with L SLS) x 7 cones each LE    Other Standing Ankle Exercises  bean bag toss in staggered stance L forward and tandem stand L forward with L LE on airex pad x 12 bags each way       Ankle Exercises: Stretches   Soleus Stretch  1 rep;30 seconds    Soleus Stretch Limitations  standing leaning into counter     Gastroc Stretch  1 rep;30 seconds    Gastroc Stretch Limitations  standing leaning into counter              PT Education - 03/15/18 1635    Education Details  HEP update; red looped TB issued to pt. for DF march     Person(s) Educated  Patient    Methods  Explanation;Demonstration;Verbal cues;Handout    Comprehension  Verbalized understanding;Returned demonstration;Verbal cues required;Need further instruction       PT Short Term Goals - 03/05/18 1703      PT SHORT TERM GOAL #1   Title  Patient to be independnent with initial HEP.    Time  4    Period  Weeks    Status  Achieved        PT Long Term Goals - 02/26/18 1819      PT LONG TERM GOAL #1   Title  Patient to be independent with advanced HEP.    Time   8    Period  Weeks    Status  On-going      PT LONG TERM GOAL #2   Title  Patient to demonstrate L ankle AROM/PROM [facility] and without pain limiting.     Time  8    Period  Weeks    Status  On-going      PT LONG TERM GOAL #3   Title  Patient to demonstrate >=4+/5 strength in B LEs.    Time  8    Period  Weeks    Status  On-going      PT LONG TERM GOAL #4   Title  Patient to report tolerance of 4 hours of standing/walking in tennis shoe without pain limiting.     Time  8    Period  Weeks    Status  On-going      PT LONG TERM GOAL #5   Title  Patient to report full day of work without pain or edema limiting.     Status  On-going            Plan - 03/15/18 1625    Clinical Impression Statement  Pt. reporting good results from recent doppler imaging showing DVT decreasing in size in L calf.  Reports next doppler scheduled for 03/28/18.  Did note some L ankle aching pain after last session which is somewhat improved today.  Pt. admits to continued daily 14-hour shifts at work and has been working 7 days/wk since beginning of January.  Has had difficulty finding time for icing and elevation of L ankle.  Tolerated all dynamic standing activities in session well today focused on progression of SLS activities with multitasking.  Ended visit with pt. noting no rise in pain from baseline.  Pt. declined ice to end session.  Will continue to progress toward goals.  Clinical Impairments Affecting Rehab Potential  GBS, current DVT in L calf    PT Treatment/Interventions  ADLs/Self Care Home Management;Cryotherapy;Functional mobility training;Stair training;Gait training;DME Instruction;Moist Heat;Therapeutic activities;Therapeutic exercise;Balance training;Neuromuscular re-education;Patient/family education;Orthotic Fit/Training;Passive range of motion;Manual techniques;Dry needling;Energy conservation;Splinting;Taping    PT Next Visit Plan  progress L ankle strengthening    Consulted and Agree  with Plan of Care  Patient       Patient will benefit from skilled therapeutic intervention in order to improve the following deficits and impairments:  Decreased range of motion, Difficulty walking, Decreased activity tolerance, Pain, Decreased balance, Hypomobility, Impaired flexibility, Increased edema, Decreased strength  Visit Diagnosis: Pain in left ankle and joints of left foot  Stiffness of left ankle, not elsewhere classified  Muscle weakness (generalized)  Difficulty in walking, not elsewhere classified  Localized edema     Problem List There are no active problems to display for this patient.   Kermit Balo, PTA 03/15/18 4:47 PM    [facility] [address]  [address] [place], [place], [address] Phone: [phone]   Fax:  [phone]  Name: Gabriella Campbell MRN: [id number] Date of Birth: 16-Jun-1955

## 2018-03-19 ENCOUNTER — Ambulatory Visit: Payer: BLUE CROSS/BLUE SHIELD

## 2018-03-19 DIAGNOSIS — M6281 Muscle weakness (generalized): Secondary | ICD-10-CM

## 2018-03-19 DIAGNOSIS — M25572 Pain in left ankle and joints of left foot: Secondary | ICD-10-CM | POA: Diagnosis not present

## 2018-03-19 DIAGNOSIS — M25672 Stiffness of left ankle, not elsewhere classified: Secondary | ICD-10-CM

## 2018-03-19 DIAGNOSIS — R600 Localized edema: Secondary | ICD-10-CM

## 2018-03-19 DIAGNOSIS — R262 Difficulty in walking, not elsewhere classified: Secondary | ICD-10-CM

## 2018-03-19 NOTE — Therapy (Signed)
[facility] Outpatient Rehabilitation [facility] [address]  [address] [place], [place], [address] Phone: [phone]   Fax:  [phone]  Physical Therapy Treatment  Patient Details  Name: Gabriella Campbell MRN: [id number] Date of Birth: [birth year] Referring Provider (PT): Doneen Poisson, MD   Encounter Date: 03/19/2018  PT End of Session - 03/19/18 1557    Visit Number  8    Number of Visits  17    Date for PT Re-Evaluation  04/18/18    Authorization Type  BCBS    PT Start Time  1533    PT Stop Time  1615    PT Time Calculation (min)  42 min    Activity Tolerance  Patient tolerated treatment well    Behavior During Therapy  [facility] for tasks assessed/performed       Past Medical History:  Diagnosis Date  . Guillain Barr syndrome (HCC)     No past surgical history on file.  There were no vitals filed for this visit.  Subjective Assessment - 03/19/18 1548    Subjective  Pt. reporting pain has recently been concentrated in "forefoot"     Pertinent History  GBS    Diagnostic tests  02/15/18 L ankle xray: 3 views of the left ankle show a healed Weber a lateral malleolus fracture. The ankle mortise is intact. The soft tissue swelling is decreased when compared to previous injury films from October 2019.     Patient Stated Goals  100% recovery, full motion, being able to be on feet all day    Currently in Pain?  No/denies    Pain Score  0-No pain    Multiple Pain Sites  No                       [facility] Adult PT Treatment/Exercise - 03/19/18 1602      Knee/Hip Exercises: Standing   Forward Step Up  Left;10 reps;Step Height: 8";Hand Hold: 1    Other Standing Knee Exercises  Alternating hip flexion + DF march with red looped TB at forefoot x 10 reps each LE - well tolerated    Light UE support      Ankle Exercises: Standing   Other Standing Ankle Exercises  Standing 4-way ankle with red TB x 10 reps each way; 2 ski poles       Ankle  Exercises: Aerobic   Nustep  L4 x 7 min LEs only      Ankle Exercises: Stretches   Gastroc Stretch  1 rep;30 seconds    Gastroc Stretch Limitations  standing leaning into counter       Ankle Exercises: Seated   Other Seated Ankle Exercises  L foot arch creation x 10 rpes - required cueing for proper technique    Other Seated Ankle Exercises  L foot instrinsic strengtening with marble pickup x 30 marbles                PT Short Term Goals - 03/05/18 1703      PT SHORT TERM GOAL #1   Title  Patient to be independnent with initial HEP.    Time  4    Period  Weeks    Status  Achieved        PT Long Term Goals - 02/26/18 1819      PT LONG TERM GOAL #1   Title  Patient to be independent with advanced HEP.    Time  8  Period  Weeks    Status  On-going      PT LONG TERM GOAL #2   Title  Patient to demonstrate L ankle AROM/PROM WFL and without pain limiting.     Time  8    Period  Weeks    Status  On-going      PT LONG TERM GOAL #3   Title  Patient to demonstrate >=4+/5 strength in B LEs.    Time  8    Period  Weeks    Status  On-going      PT LONG TERM GOAL #4   Title  Patient to report tolerance of 4 hours of standing/walking in tennis shoe without pain limiting.     Time  8    Period  Weeks    Status  On-going      PT LONG TERM GOAL #5   Title  Patient to report full day of work without pain or edema limiting.     Status  On-going            Plan - 03/19/18 1601    Clinical Impression Statement  Pt. primary concern today is pain along L "forefoot" with prolonged standing and walking.  Able to tolerate addition of 4-way hip kicker with red TB, and progression of proprioception activities well today.  Initiated foot intrinsic strengthening activities without issue today however pt. noting L foot fatigue following this.  Ended visit pain free.  Will continue to progress.      Clinical Impairments Affecting Rehab Potential  GBS, current DVT in L calf     PT Treatment/Interventions  ADLs/Self Care Home Management;Cryotherapy;Functional mobility training;Stair training;Gait training;DME Instruction;Moist Heat;Therapeutic activities;Therapeutic exercise;Balance training;Neuromuscular re-education;Patient/family education;Orthotic Fit/Training;Passive range of motion;Manual techniques;Dry needling;Energy conservation;Splinting;Taping    PT Next Visit Plan  progress L ankle strengthening    Consulted and Agree with Plan of Care  Patient       Patient will benefit from skilled therapeutic intervention in order to improve the following deficits and impairments:  Decreased range of motion, Difficulty walking, Decreased activity tolerance, Pain, Decreased balance, Hypomobility, Impaired flexibility, Increased edema, Decreased strength  Visit Diagnosis: Pain in left ankle and joints of left foot  Stiffness of left ankle, not elsewhere classified  Muscle weakness (generalized)  Difficulty in walking, not elsewhere classified  Localized edema     Problem List There are no active problems to display for this patient.   Kermit Balo, PTA 03/19/18 6:34 PM   [facility] Outpatient Rehabilitation [facility] [address]  [address] [place], [place], [address] Phone: [phone]   Fax:  [phone]  Name: Jocely Sorrento MRN: [id number] Date of Birth: [birth year]

## 2018-03-22 ENCOUNTER — Encounter: Payer: Self-pay | Admitting: Physical Therapy

## 2018-03-22 ENCOUNTER — Ambulatory Visit: Payer: BLUE CROSS/BLUE SHIELD | Admitting: Physical Therapy

## 2018-03-22 DIAGNOSIS — R600 Localized edema: Secondary | ICD-10-CM

## 2018-03-22 DIAGNOSIS — M25672 Stiffness of left ankle, not elsewhere classified: Secondary | ICD-10-CM

## 2018-03-22 DIAGNOSIS — M25572 Pain in left ankle and joints of left foot: Secondary | ICD-10-CM

## 2018-03-22 DIAGNOSIS — M6281 Muscle weakness (generalized): Secondary | ICD-10-CM

## 2018-03-22 DIAGNOSIS — R262 Difficulty in walking, not elsewhere classified: Secondary | ICD-10-CM

## 2018-03-22 NOTE — Therapy (Signed)
[facility] Outpatient Rehabilitation [facility] [address]  [address] [place], [place], [address] Phone: [phone]   Fax:  [phone]  Physical Therapy Treatment  Patient Details  Name: Gabriella Campbell MRN: [id number] Date of Birth: January 10, 1956 Referring Provider (PT): Doneen Poisson, MD   Encounter Date: 03/22/2018  PT End of Session - 03/22/18 1805    Visit Number  9    Number of Visits  17    Date for PT Re-Evaluation  04/18/18    Authorization Type  BCBS    PT Start Time  1620    PT Stop Time  1709    PT Time Calculation (min)  49 min    Equipment Utilized During Treatment  Gait belt    Activity Tolerance  Patient tolerated treatment well    Behavior During Therapy  [facility] for tasks assessed/performed       Past Medical History:  Diagnosis Date  . Guillain Barr syndrome [facility])     History reviewed. No pertinent surgical history.  There were no vitals filed for this visit.  Subjective Assessment - 03/22/18 1623    Subjective  Reports she was on her feet in shoe for 6 hours, then had to put boot back on d/t the need for support. Notes her intrinsic foot muscles are week.     Pertinent History  GBS    Diagnostic tests  02/15/18 L ankle xray: 3 views of the left ankle show a healed Weber a lateral malleolus fracture. The ankle mortise is intact. The soft tissue swelling is decreased when compared to previous injury films from October 2019.     Patient Stated Goals  100% recovery, full motion, being able to be on feet all day    Currently in Pain?  Yes    Pain Score  5     Pain Location  Ankle    Pain Orientation  Left    Pain Descriptors / Indicators  Aching    Pain Type  Chronic pain                       [facility] Adult PT Treatment/Exercise - 03/22/18 0001      Self-Care   Self-Care  Other Self-Care Comments    Other Self-Care Comments   edu and practice on L foot plantar massage with ball to tolerance      Cryotherapy   Number Minutes Cryotherapy  10 Minutes    Cryotherapy Location  Ankle   L   Type of Cryotherapy  Ice pack      Ankle Exercises: Aerobic   Nustep  L3 x 6 min LEs only      Ankle Exercises: Stretches   Theme park manager  30 seconds;4 reps    Gastroc Stretch Limitations  prostretch at counter; 2x at wall    Other Stretch  L DF stretch on step in 3 directions 5x3" each direction at counter top      Ankle Exercises: Standing   SLS  L LE SLS on foam & L LE on floor + ball throw/catch x7 min   tendency to fall to L; intermittent support on chair   Heel Raises  Both;15 reps   5# in each hand; difficulty with eccentric control   Heel Walk (Round Trip)  standing marching with yellow TB around toes x15 at chair   unable to perform 20 d/t fatigue   Side Shuffle (Round Trip)  side stepping with yellow TB around  toes 2x    Other Standing Ankle Exercises  L LE standing on dynadist + anterior/posterior steping on R LE x10 with CGA   cues to hit with heel and increase contorl   Other Standing Ankle Exercises  B bent knee heel raise x15 at counter               PT Short Term Goals - 03/05/18 1703      PT SHORT TERM GOAL #1   Title  Patient to be independnent with initial HEP.    Time  4    Period  Weeks    Status  Achieved        PT Long Term Goals - 02/26/18 1819      PT LONG TERM GOAL #1   Title  Patient to be independent with advanced HEP.    Time  8    Period  Weeks    Status  On-going      PT LONG TERM GOAL #2   Title  Patient to demonstrate L ankle AROM/PROM [facility] and without pain limiting.     Time  8    Period  Weeks    Status  On-going      PT LONG TERM GOAL #3   Title  Patient to demonstrate >=4+/5 strength in B LEs.    Time  8    Period  Weeks    Status  On-going      PT LONG TERM GOAL #4   Title  Patient to report tolerance of 4 hours of standing/walking in tennis shoe without pain limiting.     Time  8    Period  Weeks    Status  On-going      PT LONG  TERM GOAL #5   Title  Patient to report full day of work without pain or edema limiting.     Status  On-going            Plan - 03/22/18 1806    Clinical Impression Statement  Patient reporting pain in L plantar surface of foot after increased WBing on it without use of brace or boot. Patient reporting that she has stopped using ankle brace d/t problems with edema, thus plantar plan likely d/t foot intrinsic fatigue/plantar fascia pain. Educated patient on use of orthotic for medial arch support and on use of ball and frozen water bottle massage for pain relief. Worked on progressive calf strengthening this session with patient able to perform heel raise both with increased dumbbell resistance as well and bent knee heel raise. Patient with question about generalized weakness diagnoses code- patient's strength deficits are more localized to L ankle weakness. Patient has overall good strength, as muscle testing demonstrated on eval.  Did c/o ankle soreness with anterior/posterior stepping with L LE standing on dynadisc which was relived with stretching. Patient continues to fall into eversion with SLS activities, but able to add dynamic balance challenge to this activity today. Ended session with ice pack to L ankle for edema and pain relief. No complaints at end of session.     Clinical Impairments Affecting Rehab Potential  GBS, current DVT in L calf    PT Treatment/Interventions  ADLs/Self Care Home Management;Cryotherapy;Functional mobility training;Stair training;Gait training;DME Instruction;Moist Heat;Therapeutic activities;Therapeutic exercise;Balance training;Neuromuscular re-education;Patient/family education;Orthotic Fit/Training;Passive range of motion;Manual techniques;Dry needling;Energy conservation;Splinting;Taping    PT Next Visit Plan  progress L ankle strengthening    Consulted and Agree with Plan of Care  Patient  Patient will benefit from skilled therapeutic intervention  in order to improve the following deficits and impairments:  Decreased range of motion, Difficulty walking, Decreased activity tolerance, Pain, Decreased balance, Hypomobility, Impaired flexibility, Increased edema, Decreased strength  Visit Diagnosis: Pain in left ankle and joints of left foot  Stiffness of left ankle, not elsewhere classified  Muscle weakness (generalized)  Difficulty in walking, not elsewhere classified  Localized edema     Problem List There are no active problems to display for this patient.   Anette GuarneriYevgeniya Kovalenko, PT, DPT 03/22/18 6:16 PM   [facility] [place] [address]  [address] [place], [place], [address] Phone: [phone]   Fax:  [phone]  Name: Gabriella Campbell MRN: [id number] Date of Birth: 09/02/1955

## 2018-03-26 ENCOUNTER — Ambulatory Visit: Payer: BLUE CROSS/BLUE SHIELD

## 2018-03-26 DIAGNOSIS — M25572 Pain in left ankle and joints of left foot: Secondary | ICD-10-CM

## 2018-03-26 DIAGNOSIS — R600 Localized edema: Secondary | ICD-10-CM

## 2018-03-26 DIAGNOSIS — M6281 Muscle weakness (generalized): Secondary | ICD-10-CM

## 2018-03-26 DIAGNOSIS — M25672 Stiffness of left ankle, not elsewhere classified: Secondary | ICD-10-CM

## 2018-03-26 DIAGNOSIS — R262 Difficulty in walking, not elsewhere classified: Secondary | ICD-10-CM

## 2018-03-26 NOTE — Therapy (Signed)
[facility] [place] [facility] [address]  [place], [place], [address] Phone: [phone]   Fax:  [phone]  Physical Therapy Treatment  Patient Details  Name: Gabriella Campbell MRN: [id number] Date of Birth: 01/09/1956 Referring Provider (PT): Jean Rosenthal, MD   Encounter Date: 03/26/2018  PT End of Session - 03/26/18 1542    Visit Number  10    Number of Visits  17    Date for PT Re-Evaluation  04/18/18    Authorization Type  BCBS    PT Start Time  7494    PT Stop Time  1619    PT Time Calculation (min)  44 min    Equipment Utilized During Treatment  Gait belt    Activity Tolerance  Patient tolerated treatment well    Behavior During Therapy  [facility] for tasks assessed/performed       Past Medical History:  Diagnosis Date  . Guillain Barr syndrome ([facility])     No past surgical history on file.  There were no vitals filed for this visit.  Subjective Assessment - 03/26/18 1538    Subjective  Pt. reporting pain remains in mid foot primarily.      Pertinent History  GBS    Diagnostic tests  02/15/18 L ankle xray: 3 views of the left ankle show a healed Weber a lateral malleolus fracture. The ankle mortise is intact. The soft tissue swelling is decreased when compared to previous injury films from October 2019.     Patient Stated Goals  100% recovery, full motion, being able to be on feet all day    Currently in Pain?  Yes    Pain Score  5     Pain Location  Foot    Pain Orientation  Left;Right   medial and lateral mal.   Pain Descriptors / Indicators  Aching    Pain Type  Chronic pain    Multiple Pain Sites  No         [facility] PT Assessment - 03/26/18 0001      Observation/Other Assessments   Focus on Therapeutic Outcomes (FOTO)   45% (55% limitation)      AROM   Left Ankle Dorsiflexion  12    Left Ankle Plantar Flexion  51    Left Ankle Inversion  34    Left Ankle Eversion  18      PROM   Left Ankle  Dorsiflexion  20    Left Ankle Plantar Flexion  55    Left Ankle Inversion  34    Left Ankle Eversion  25      Strength   Right/Left Hip  Right;Left    Right Hip Flexion  4+/5    Right Hip ABduction  4+/5    Right Hip ADduction  4+/5    Left Hip Flexion  4+/5    Left Hip ABduction  4+/5    Left Hip ADduction  4+/5    Right/Left Knee  Right;Left    Right Knee Flexion  4+/5    Right Knee Extension  5/5    Left Knee Flexion  4/5    Left Knee Extension  5/5    Right/Left Ankle  Right;Left    Right Ankle Dorsiflexion  4+/5    Right Ankle Plantar Flexion  4+/5    Right Ankle Inversion  4+/5    Right Ankle Eversion  4+/5    Left Ankle Dorsiflexion  4+/5  [facility] Adult PT Treatment/Exercise - 03/26/18 0001      Manual Therapy   Manual Therapy  --    Manual therapy comments  supine     Joint Mobilization  L A/P mobs for improved ROM     Passive ROM  L ankle PROM with gentle stretch all directions       Ankle Exercises: Aerobic   Recumbent Bike  Lvl 1, 6 min                PT Short Term Goals - 03/05/18 1703      PT SHORT TERM GOAL #1   Title  Patient to be independnent with initial HEP.    Time  4    Period  Weeks    Status  Achieved        PT Long Term Goals - 03/26/18 1545      PT LONG TERM GOAL #1   Title  Patient to be independent with advanced HEP.    Time  8    Period  Weeks    Status  Partially Met      PT LONG TERM GOAL #2   Title  Patient to demonstrate L ankle AROM/PROM [facility] and without pain limiting.     Time  8    Period  Weeks    Status  Partially Met      PT LONG TERM GOAL #3   Title  Patient to demonstrate >=4+/5 strength in B LEs.    Time  8    Period  Weeks    Status  Partially Met      PT LONG TERM GOAL #4   Title  Patient to report tolerance of 4 hours of standing/walking in tennis shoe without pain limiting.     Time  8    Period  Weeks    Status  On-going   03/26/18: pt. noting 45 min  standing/walking before needing to take a break due to rising L foot pain     PT LONG TERM GOAL #5   Title  Patient to report full day of work without pain or edema limiting.     Status  On-going            Plan - 03/26/18 1544    Clinical Impression Statement  Pt. reporting she feels her balance and strength are improving slowly with therapy however feels ankle ROM has improved significantly.  Able to partially achieve strength LTG with MMT today still demonstrating mild deficits at L ankle.  Able to partially achieve ROM goal today still with greatest deficits in L ankle EV, DF ROM.  Notes no significant improvement in weight bearing ankle pain however feels L ankle/foot pain limits her most after 45 min standing/walking at work.  Progressing well toward LTG's.    Clinical Impairments Affecting Rehab Potential  GBS, current DVT in L calf    PT Treatment/Interventions  ADLs/Self Care Home Management;Cryotherapy;Functional mobility training;Stair training;Gait training;DME Instruction;Moist Heat;Therapeutic activities;Therapeutic exercise;Balance training;Neuromuscular re-education;Patient/family education;Orthotic Fit/Training;Passive range of motion;Manual techniques;Dry needling;Energy conservation;Splinting;Taping    PT Next Visit Plan  progress L ankle strengthening    Consulted and Agree with Plan of Care  Patient       Patient will benefit from skilled therapeutic intervention in order to improve the following deficits and impairments:  Decreased range of motion, Difficulty walking, Decreased activity tolerance, Pain, Decreased balance, Hypomobility, Impaired flexibility, Increased edema, Decreased strength  Visit Diagnosis: Pain in left ankle and joints  of left foot  Stiffness of left ankle, not elsewhere classified  Muscle weakness (generalized)  Difficulty in walking, not elsewhere classified  Localized edema     Problem List There are no active problems to display  for this patient.   Bess Harvest, PTA 03/26/18 6:20 PM   [place] [address]  [place], [place], [address] Phone: [phone]   Fax:  [phone]  Name: Desarie Feild MRN: [id number] Date of Birth: 03-29-1955

## 2018-03-28 ENCOUNTER — Ambulatory Visit (HOSPITAL_COMMUNITY)
Admission: RE | Admit: 2018-03-28 | Discharge: 2018-03-28 | Disposition: A | Discharge status: Home / Self Care | Payer: BLUE CROSS/BLUE SHIELD | Source: Ambulatory Visit | Attending: Internal Medicine | Admitting: Internal Medicine

## 2018-03-28 DIAGNOSIS — I824Y2 Acute embolism and thrombosis of unspecified deep veins of left proximal lower extremity: Secondary | ICD-10-CM | POA: Diagnosis not present

## 2018-03-28 DIAGNOSIS — I824Y9 Acute embolism and thrombosis of unspecified deep veins of unspecified proximal lower extremity: Secondary | ICD-10-CM | POA: Insufficient documentation

## 2018-03-29 ENCOUNTER — Ambulatory Visit: Payer: BLUE CROSS/BLUE SHIELD | Admitting: Physical Therapy

## 2018-03-29 ENCOUNTER — Encounter (INDEPENDENT_AMBULATORY_CARE_PROVIDER_SITE_OTHER): Payer: Self-pay | Admitting: Orthopaedic Surgery

## 2018-03-29 ENCOUNTER — Ambulatory Visit (INDEPENDENT_AMBULATORY_CARE_PROVIDER_SITE_OTHER): Payer: BLUE CROSS/BLUE SHIELD | Admitting: Orthopaedic Surgery

## 2018-03-29 ENCOUNTER — Ambulatory Visit (INDEPENDENT_AMBULATORY_CARE_PROVIDER_SITE_OTHER): Payer: BLUE CROSS/BLUE SHIELD

## 2018-03-29 DIAGNOSIS — S8265XD Nondisplaced fracture of lateral malleolus of left fibula, subsequent encounter for closed fracture with routine healing: Secondary | ICD-10-CM

## 2018-03-29 NOTE — Progress Notes (Signed)
Gabriella Campbell is now almost 4 months status post an injury to her left ankle in which she sustained a nondisplaced Weber a lateral malleolus fracture.  She had was treated first with a cam walking boot.  She has been to physical therapy as well now.  Post injury she did develop a DVT although she was never casted.  She is now off of blood thinning medications because a follow-up ultrasound showed the data resolved.  She is doing well otherwise.  She did feel a crack in her ankle recently during physical therapy but they felt that was scar tissue.  Her gait is improved when she walks around.  Examination of her left ankle shows that the soft tissue swelling is still present but much much less.  Her range of motion is improved overall in the ankle hesitancy stable.  3 views left ankle show that the ankle mortise is intact and the fracture is healed completely.  The soft tissue swelling is decreased quite a bit when compared to her post injury films.  At this point follow-up will be as needed.  She understands that swelling can take up to a year to go away.  If she has any issues with her ankle at all she will let us know.  She understands that she still could have some cartilage damage having had an injury such as this and that if she continues to have problems at all we would MRI the ankle.  Otherwise follow-up again is as needed.  All question concerns were answered and addressed.

## 2018-03-30 ENCOUNTER — Telehealth: Payer: Self-pay | Admitting: Internal Medicine

## 2018-03-30 NOTE — Telephone Encounter (Signed)
Attempted to call pt but unable to reach. Left message for pt to return call. 

## 2018-03-30 NOTE — Telephone Encounter (Signed)
Pt returned call. I stated the results to pt of the duplex results and told her if it has been 2-3 months since she started the eliquis that MR said she could stop the anticoagulation.  Pt expressed understanding and stated it has been 12 weeks since she began. Nothing further needed.

## 2018-03-30 NOTE — Telephone Encounter (Signed)
Let Gabriella Campbell know that 03/28/2018 duplex shows resolution of DVT. With d-dimer normal on 02/27/2018 and if is 2- 3 months since Rx started she can stop antiocoagulation.

## 2018-04-02 ENCOUNTER — Ambulatory Visit: Payer: BLUE CROSS/BLUE SHIELD

## 2018-04-03 ENCOUNTER — Telehealth (INDEPENDENT_AMBULATORY_CARE_PROVIDER_SITE_OTHER): Payer: Self-pay | Admitting: Orthopaedic Surgery

## 2018-04-03 ENCOUNTER — Ambulatory Visit: Payer: BLUE CROSS/BLUE SHIELD

## 2018-04-03 DIAGNOSIS — R262 Difficulty in walking, not elsewhere classified: Secondary | ICD-10-CM

## 2018-04-03 DIAGNOSIS — M25572 Pain in left ankle and joints of left foot: Secondary | ICD-10-CM | POA: Diagnosis not present

## 2018-04-03 DIAGNOSIS — M25672 Stiffness of left ankle, not elsewhere classified: Secondary | ICD-10-CM

## 2018-04-03 DIAGNOSIS — R600 Localized edema: Secondary | ICD-10-CM

## 2018-04-03 DIAGNOSIS — M6281 Muscle weakness (generalized): Secondary | ICD-10-CM

## 2018-04-03 NOTE — Telephone Encounter (Signed)
Received call from patient mailed medical records release form per patient request

## 2018-04-03 NOTE — Therapy (Signed)
[facility] [address]  [place], [place], [address] Phone: [phone]   Fax:  [phone]  Physical Therapy Treatment  Patient Details  Name: Gabriella Campbell MRN: [id number] Date of Birth: August 10, 1955 Referring Provider (PT): Jean Rosenthal, MD   Encounter Date: 04/03/2018  PT End of Session - 04/03/18 1711    Visit Number  11    Number of Visits  17    Date for PT Re-Evaluation  04/18/18    Authorization Type  BCBS    PT Start Time  1707    PT Stop Time  2355   ended with 10 min ice pack   PT Time Calculation (min)  48 min    Equipment Utilized During Treatment  --    Activity Tolerance  Patient tolerated treatment well    Behavior During Therapy  [facility] for tasks assessed/performed       Past Medical History:  Diagnosis Date  . Guillain Barr syndrome ([facility])     No past surgical history on file.  There were no vitals filed for this visit.  Subjective Assessment - 04/03/18 1722    Subjective  Reports she gets time off of work this weekend.      Pertinent History  GBS    Diagnostic tests  02/15/18 L ankle xray: 3 views of the left ankle show a healed Weber a lateral malleolus fracture. The ankle mortise is intact. The soft tissue swelling is decreased when compared to previous injury films from October 2019.     Patient Stated Goals  100% recovery, full motion, being able to be on feet all day    Currently in Pain?  Yes    Pain Score  3     Pain Location  Foot    Pain Orientation  Left    Pain Descriptors / Indicators  Aching         [facility] PT Assessment - 04/03/18 0001      AROM   Left Ankle Dorsiflexion  12                   [facility] Adult PT Treatment/Exercise - 04/03/18 1724      Cryotherapy   Number Minutes Cryotherapy  20 Minutes    Cryotherapy Location  Ankle   L   Type of Cryotherapy  Ice pack      Manual Therapy   Manual therapy comments  supine     Joint Mobilization   L A/P mobs for improved ROM     Passive ROM  L ankle manual gastroc stretch       Ankle Exercises: Standing   Vector Stance  Left   L SLS with opp LE cone toe touch 5 x 3 cone at counter    Heel Raises  Both;15 reps   2 sets; 2nd set B con/B ecc   Heel Raises Limitations  B con/L ecc     Heel Walk (Round Trip)  2 x 15 ft at counter     Toe Walk (Round Trip)  attempted however unable to control due to report of weakness     Side Shuffle (Round Trip)  side stepping with red TB around forefoot 2 x 20 ft       Ankle Exercises: Stretches   Soleus Stretch  1 rep;30 seconds    Soleus Stretch Limitations  standing leaning into counter    some rise in pain however self-resolved with rest  Gastroc Stretch  2 reps;30 seconds    Gastroc Stretch Limitations  leaning into counter       Ankle Exercises: Aerobic   Nustep  L3 x 7 min LEs only               PT Short Term Goals - 03/05/18 1703      PT SHORT TERM GOAL #1   Title  Patient to be independnent with initial HEP.    Time  4    Period  Weeks    Status  Achieved        PT Long Term Goals - 03/26/18 1545      PT LONG TERM GOAL #1   Title  Patient to be independent with advanced HEP.    Time  8    Period  Weeks    Status  Partially Met      PT LONG TERM GOAL #2   Title  Patient to demonstrate L ankle AROM/PROM [facility] and without pain limiting.     Time  8    Period  Weeks    Status  Partially Met      PT LONG TERM GOAL #3   Title  Patient to demonstrate >=4+/5 strength in B LEs.    Time  8    Period  Weeks    Status  Partially Met      PT LONG TERM GOAL #4   Title  Patient to report tolerance of 4 hours of standing/walking in tennis shoe without pain limiting.     Time  8    Period  Weeks    Status  On-going   03/26/18: pt. noting 45 min standing/walking before needing to take a break due to rising L foot pain     PT LONG TERM GOAL #5   Title  Patient to report full day of work without pain or edema limiting.      Status  On-going            Plan - 04/03/18 1713    Clinical Impression Statement  Pt. reporting she had second Dopper test on 03/28/18 which showed resolution of DVTs and pt. now off blood thinners.  Pt. noting she is pain free in L ankle/foot while seated most of the time however still unable to walk or stand at work pain free.  Pt. reporting average standing pain level tends to be 3-4/10 on day-day basis.  Reports MD reminded her she may have swelling for 6-12 months after injury however pt. interested in MRI to address possible cartilage damage.  Pt. tolerated all SLS stability and ankle strengthening activities well today however unable to perform toe walk at countertop due to weakness.  Ended visit with ice pack to L foot/ankle to reduce post-exercise swelling.      Rehab Potential  Good    Clinical Impairments Affecting Rehab Potential  GBS, current DVT in L calf    PT Treatment/Interventions  ADLs/Self Care Home Management;Cryotherapy;Functional mobility training;Stair training;Gait training;DME Instruction;Moist Heat;Therapeutic activities;Therapeutic exercise;Balance training;Neuromuscular re-education;Patient/family education;Orthotic Fit/Training;Passive range of motion;Manual techniques;Dry needling;Energy conservation;Splinting;Taping    PT Next Visit Plan  progress L ankle strengthening    Consulted and Agree with Plan of Care  Patient       Patient will benefit from skilled therapeutic intervention in order to improve the following deficits and impairments:  Decreased range of motion, Difficulty walking, Decreased activity tolerance, Pain, Decreased balance, Hypomobility, Impaired flexibility, Increased edema, Decreased strength  Visit Diagnosis:  Pain in left ankle and joints of left foot  Stiffness of left ankle, not elsewhere classified  Muscle weakness (generalized)  Difficulty in walking, not elsewhere classified  Localized edema     Problem List There are  no active problems to display for this patient.   Bess Harvest, PTA 04/03/18 6:10 PM   [facility] [address]  [place], [place], [address] Phone: [phone]   Fax:  [phone]  Name: Gabriella Campbell MRN: [id number] Date of Birth: 14-Jul-1955

## 2018-04-05 ENCOUNTER — Encounter: Payer: Self-pay | Admitting: Physical Therapy

## 2018-04-05 ENCOUNTER — Ambulatory Visit: Payer: BLUE CROSS/BLUE SHIELD | Admitting: Physical Therapy

## 2018-04-05 DIAGNOSIS — R600 Localized edema: Secondary | ICD-10-CM

## 2018-04-05 DIAGNOSIS — R262 Difficulty in walking, not elsewhere classified: Secondary | ICD-10-CM

## 2018-04-05 DIAGNOSIS — M25672 Stiffness of left ankle, not elsewhere classified: Secondary | ICD-10-CM

## 2018-04-05 DIAGNOSIS — M25572 Pain in left ankle and joints of left foot: Secondary | ICD-10-CM

## 2018-04-05 DIAGNOSIS — M6281 Muscle weakness (generalized): Secondary | ICD-10-CM

## 2018-04-05 NOTE — Therapy (Signed)
[facility] [address]  [place], [place], [address] Phone: [phone]   Fax:  [phone]  Physical Therapy Treatment  Patient Details  Name: Gabriella Campbell MRN: [id number] Date of Birth: Feb 20, 1955 Referring Provider (PT): Jean Rosenthal, MD   Encounter Date: 04/05/2018  PT End of Session - 04/05/18 1628    Visit Number  12    Number of Visits  17    Date for PT Re-Evaluation  04/18/18    Authorization Type  BCBS    PT Start Time  1623    PT Stop Time  1705    PT Time Calculation (min)  42 min    Activity Tolerance  Patient tolerated treatment well    Behavior During Therapy  [facility] for tasks assessed/performed       Past Medical History:  Diagnosis Date  . Guillain Barr syndrome [facility])     History reviewed. No pertinent surgical history.  There were no vitals filed for this visit.  Subjective Assessment - 04/05/18 1624    Subjective  Patient confirms that her DVTs have fully resolved and xray showed complete healing of L ankle fracture. Notes that she still has trouble walking however. Has been on her feet for most of the day today.     Pertinent History  GBS    Diagnostic tests  02/15/18 L ankle xray: 3 views of the left ankle show a healed Weber a lateral malleolus fracture. The ankle mortise is intact. The soft tissue swelling is decreased when compared to previous injury films from October 2019.     Patient Stated Goals  100% recovery, full motion, being able to be on feet all day    Currently in Pain?  Yes    Pain Score  3     Pain Location  Foot    Pain Orientation  Left    Pain Descriptors / Indicators  Aching    Pain Type  Chronic pain                       [facility] Adult PT Treatment/Exercise - 04/05/18 0001      Exercises   Exercises  Knee/Hip      Knee/Hip Exercises: Standing   Knee Flexion  Strengthening;Right;Left;1 set;15 reps    Knee Flexion Limitations  HS curl  4# at counter top    Hip Abduction  Stengthening;Right;Left;1 set;10 reps;Knee straight    Abduction Limitations  at coutner with 4#   cues to maintain toes forward   Wall Squat  1 set;15 reps    Wall Squat Limitations  cues to keep L heel down    Other Standing Knee Exercises  monster walk with yellow TB around ankles 66f forward/342fbackward   cues for form & safety; patient distracted by her phone     Knee/Hip Exercises: Supine   Bridges  Strengthening;Both;1 set;15 reps    Bridges Limitations  HS bridge     Other Supine Knee/Hip Exercises  supine resisted hip flexion with yellow TB around toes on orange pball x20      Cryotherapy   Number Minutes Cryotherapy  10 Minutes    Cryotherapy Location  Ankle   L   Type of Cryotherapy  Ice pack      Ankle Exercises: Aerobic   Nustep  L3 x 6 min LEs only      Ankle Exercises: Standing   Heel Raises Limitations  B LE  with increased weight shift over L LE x15 at counter    Other Standing Ankle Exercises  B heel raise with 10# in each hand x15   limited ROM     Ankle Exercises: Stretches   Gastroc Stretch  2 reps;30 seconds    Gastroc Stretch Limitations  prostretch at [employer]; on L             PT Education - 04/05/18 1657    Education Details  update to [employer]) Educated  Patient    Methods  Explanation;Demonstration;Tactile cues;Verbal cues;Handout    Comprehension  Verbalized understanding;Returned demonstration       PT Short Term Goals - 03/05/18 1703      PT SHORT TERM GOAL #1   Title  Patient to be independnent with initial HEP.    Time  4    Period  Weeks    Status  Achieved        PT Long Term Goals - 03/26/18 1545      PT LONG TERM GOAL #1   Title  Patient to be independent with advanced HEP.    Time  8    Period  Weeks    Status  Partially Met      PT LONG TERM GOAL #2   Title  Patient to demonstrate L ankle AROM/PROM [facility] and without pain limiting.     Time  8    Period  Weeks     Status  Partially Met      PT LONG TERM GOAL #3   Title  Patient to demonstrate >=4+/5 strength in B LEs.    Time  8    Period  Weeks    Status  Partially Met      PT LONG TERM GOAL #4   Title  Patient to report tolerance of 4 hours of standing/walking in tennis shoe without pain limiting.     Time  8    Period  Weeks    Status  On-going   03/26/18: pt. noting 45 min standing/walking before needing to take a break due to rising L foot pain     PT LONG TERM GOAL #5   Title  Patient to report full day of work without pain or edema limiting.     Status  On-going            Plan - 04/05/18 1658    Clinical Impression Statement  Patient arrived to session confirming full resolution of DVTs in her L calf as well as complete healing of fracture shown on recent x-ray. Notes that she still has trouble ambulating for long periods. Began session with focus on hip and HS strengthening on mat- patient with good tolerance. Patient able to tolerate progression of heel raises this session with no complaints. Introduced standing hip strengthening exercises with patient demonstrating good control with hip abduction, however difficulty maintaining "soft knees" and proper form with monster walks. Patient without pain at end of session, however lateral ankle edema evident from being on her feet all day. Thus, received ice pack to L ankle at end of session. Normal integumentary response observed and with no complaints at end of session.     Clinical Impairments Affecting Rehab Potential  GBS    PT Treatment/Interventions  ADLs/Self Care Home Management;Cryotherapy;Functional mobility training;Stair training;Gait training;DME Instruction;Moist Heat;Therapeutic activities;Therapeutic exercise;Balance training;Neuromuscular re-education;Patient/family education;Orthotic Fit/Training;Passive range of motion;Manual techniques;Dry needling;Energy conservation;Splinting;Taping    PT Next Visit Plan  progress L ankle  strengthening    Consulted and Agree with Plan of Care  Patient       Patient will benefit from skilled therapeutic intervention in order to improve the following deficits and impairments:  Decreased range of motion, Difficulty walking, Decreased activity tolerance, Pain, Decreased balance, Hypomobility, Impaired flexibility, Increased edema, Decreased strength  Visit Diagnosis: Pain in left ankle and joints of left foot  Stiffness of left ankle, not elsewhere classified  Muscle weakness (generalized)  Difficulty in walking, not elsewhere classified  Localized edema     Problem List There are no active problems to display for this patient.    [name], PT, DPT 04/05/18 5:56 PM   [facility] [address]  [place], [place], [address] Phone: [phone]   Fax:  [phone]  Name: Gabriella Campbell MRN: [id number] Date of Birth: 02-13-1955

## 2018-04-09 ENCOUNTER — Ambulatory Visit: Payer: BLUE CROSS/BLUE SHIELD

## 2018-04-09 ENCOUNTER — Ambulatory Visit: Payer: BLUE CROSS/BLUE SHIELD | Admitting: Podiatry

## 2018-04-09 ENCOUNTER — Encounter: Payer: Self-pay | Admitting: Podiatry

## 2018-04-09 ENCOUNTER — Ambulatory Visit: Payer: BLUE CROSS/BLUE SHIELD | Admitting: Physical Therapy

## 2018-04-09 VITALS — BP 167/98 | HR 80

## 2018-04-09 DIAGNOSIS — M659 Synovitis and tenosynovitis, unspecified: Secondary | ICD-10-CM | POA: Diagnosis not present

## 2018-04-09 DIAGNOSIS — M79672 Pain in left foot: Secondary | ICD-10-CM

## 2018-04-10 ENCOUNTER — Ambulatory Visit: Payer: BLUE CROSS/BLUE SHIELD | Attending: Orthopaedic Surgery

## 2018-04-10 DIAGNOSIS — M25572 Pain in left ankle and joints of left foot: Secondary | ICD-10-CM | POA: Insufficient documentation

## 2018-04-10 DIAGNOSIS — R262 Difficulty in walking, not elsewhere classified: Secondary | ICD-10-CM | POA: Diagnosis present

## 2018-04-10 DIAGNOSIS — M25672 Stiffness of left ankle, not elsewhere classified: Secondary | ICD-10-CM | POA: Diagnosis present

## 2018-04-10 DIAGNOSIS — M6281 Muscle weakness (generalized): Secondary | ICD-10-CM | POA: Diagnosis present

## 2018-04-10 DIAGNOSIS — R600 Localized edema: Secondary | ICD-10-CM | POA: Diagnosis present

## 2018-04-10 NOTE — Therapy (Addendum)
[facility] [address]  [place], [place], [address] Phone: [phone]   Fax:  [phone]  Physical Therapy Treatment  Patient Details  Name: Gabriella Campbell MRN: [id number] Date of Birth: 06-09-1955 Referring Provider (PT): Jean Rosenthal, MD   Encounter Date: 04/10/2018  PT End of Session - 04/10/18 1630    Visit Number  13    Number of Visits  17    Date for PT Re-Evaluation  04/18/18    Authorization Type  BCBS    PT Start Time  1624   pt. arrived late to session    PT Stop Time  1658    PT Time Calculation (min)  34 min    Activity Tolerance  Patient tolerated treatment well    Behavior During Therapy  [facility] for tasks assessed/performed       Past Medical History:  Diagnosis Date  . Guillain Barr syndrome ([place])     No past surgical history on file.  There were no vitals filed for this visit.  Subjective Assessment - 04/10/18 1628    Subjective  Pt. reporting she saw podiatrist who is ordering her orthotics.      Pertinent History  GBS    Diagnostic tests  02/15/18 L ankle xray: 3 views of the left ankle show a healed Weber a lateral malleolus fracture. The ankle mortise is intact. The soft tissue swelling is decreased when compared to previous injury films from October 2019.     Patient Stated Goals  100% recovery, full motion, being able to be on feet all day    Currently in Pain?  No/denies    Pain Score  0-No pain    Pain Location  Foot    Pain Orientation  Left    Pain Descriptors / Indicators  Aching    Pain Type  Chronic pain    Multiple Pain Sites  No                       [facility] Adult PT Treatment/Exercise - 04/10/18 1641      Knee/Hip Exercises: Standing   Heel Raises  Both;20 reps    Heel Raises Limitations  at counter     Hip Flexion  Right;10 reps;Knee straight;Stengthening    Hip Flexion Limitations  red TB at L ankle; 2 ski poles     Hip ADduction  Right;10  reps;Strengthening    Hip ADduction Limitations  red TB at L ankle; 2 ski poles     Hip Abduction  Right;10 reps;Knee straight;Stengthening    Abduction Limitations  red TB at L ankle; 2 ski poles     Hip Extension  Right;10 reps;Stengthening;Knee straight    Extension Limitations  red TB at L ankle; 2 ski poles     Step Down  Left;10 reps;Step Height: 4";Hand Hold: 2    Step Down Limitations  reduce control evident at L ankle     SLS with Vectors  L SLS with opposite LE toe-touch to cones 3 x 5 cones    light UE support on counter     Cryotherapy   Number Minutes Cryotherapy  10 Minutes    Cryotherapy Location  Ankle   L    Type of Cryotherapy  Ice pack      Ankle Exercises: Aerobic   Nustep  L3 x 7 min LEs only      Ankle Exercises: Standing   Heel Walk (Round  Trip)  4 x 15 ft at counter     Other Standing Ankle Exercises  Standing TRX squat + heel raise at bottom of movement 5" x 10 rpes                PT Short Term Goals - 03/05/18 1703      PT SHORT TERM GOAL #1   Title  Patient to be independnent with initial HEP.    Time  4    Period  Weeks    Status  Achieved        PT Long Term Goals - 03/26/18 1545      PT LONG TERM GOAL #1   Title  Patient to be independent with advanced HEP.    Time  8    Period  Weeks    Status  Partially Met      PT LONG TERM GOAL #2   Title  Patient to demonstrate L ankle AROM/PROM [facility] and without pain limiting.     Time  8    Period  Weeks    Status  Partially Met      PT LONG TERM GOAL #3   Title  Patient to demonstrate >=4+/5 strength in B LEs.    Time  8    Period  Weeks    Status  Partially Met      PT LONG TERM GOAL #4   Title  Patient to report tolerance of 4 hours of standing/walking in tennis shoe without pain limiting.     Time  8    Period  Weeks    Status  On-going   03/26/18: pt. noting 45 min standing/walking before needing to take a break due to rising L foot pain     PT LONG TERM GOAL #5   Title   Patient to report full day of work without pain or edema limiting.     Status  On-going            Plan - 04/10/18 1633    Clinical Impression Statement  Pt. arrived late today thus session time limited.  Pt. reporting she had increased L foot/ankle swelling after last session and attributes this to exercise in session.  Saw podiatrist yesterday who plans to fit her for orthotics.  Tolerated all SLS and dynamic standing activities in session well today.  Ended visit with L ankle/foot ice pack applied to reduce post-exercise swelling and pain.      Rehab Potential  Good    Clinical Impairments Affecting Rehab Potential  GBS    PT Treatment/Interventions  ADLs/Self Care Home Management;Cryotherapy;Functional mobility training;Stair training;Gait training;DME Instruction;Moist Heat;Therapeutic activities;Therapeutic exercise;Balance training;Neuromuscular re-education;Patient/family education;Orthotic Fit/Training;Passive range of motion;Manual techniques;Dry needling;Energy conservation;Splinting;Taping    PT Next Visit Plan  progress L ankle strengthening    Consulted and Agree with Plan of Care  Patient       Patient will benefit from skilled therapeutic intervention in order to improve the following deficits and impairments:  Decreased range of motion, Difficulty walking, Decreased activity tolerance, Pain, Decreased balance, Hypomobility, Impaired flexibility, Increased edema, Decreased strength  Visit Diagnosis: Pain in left ankle and joints of left foot  Stiffness of left ankle, not elsewhere classified  Muscle weakness (generalized)  Difficulty in walking, not elsewhere classified  Localized edema     Problem List There are no active problems to display for this patient.   Bess Harvest, PTA 04/10/18 6:11 PM   [facility]  [address]  [place], [place], [address] Phone: [phone]   Fax:   [phone]  Name: Brookelin Felber MRN: [id number] Date of Birth: Apr 19, 1955

## 2018-04-11 NOTE — Progress Notes (Signed)
   Subjective:  63 year old female presenting today as a new patient with a chief complaint of left ankle pain that began over a year ago secondary to a fracture of the distal fibula in October 2018. She reports continued swelling as well. Walking for long periods of time makes her symptoms worse. She has not had any recent treatment for her symptoms. Patient is here for further evaluation and treatment.   Past Medical History:  Diagnosis Date  . Guillain Barr syndrome ([facility])      Objective / Physical Exam:  General:  The patient is alert and oriented x3 in no acute distress. Dermatology:  Skin is warm, dry and supple bilateral lower extremities. Negative for open lesions or macerations. Vascular:  Palpable pedal pulses bilaterally. No edema or erythema noted. Capillary refill within normal limits. Neurological:  Epicritic and protective threshold grossly intact bilaterally.  Musculoskeletal Exam:  Pain on palpation to the anterior lateral medial aspects of the patient's left ankle. Mild edema noted. Range of motion within normal limits to all pedal and ankle joints bilateral. Muscle strength 5/5 in all groups bilateral.   Assessment: 1. Left ankle synovitis  2. H/o left ankle fracture - October 2018  Plan of Care:  1. Patient was evaluated. X-Rays in Epic reviewed.  2. Appointment with Raiford Noble, Pedorthist, for custom molded orthotics.  3. Recommended good shoe gear.  4. Continue physical therapy as needed.  5. Return to clinic as needed.    Felecia Shelling, DPM [facility]  Dr. Felecia Shelling, DPM    [address]                                        [place], [place] [address]                Office [phone]  Fax [phone]

## 2018-04-12 ENCOUNTER — Ambulatory Visit: Payer: BLUE CROSS/BLUE SHIELD | Admitting: Physical Therapy

## 2018-04-12 ENCOUNTER — Encounter: Payer: Self-pay | Admitting: Physical Therapy

## 2018-04-12 DIAGNOSIS — M6281 Muscle weakness (generalized): Secondary | ICD-10-CM

## 2018-04-12 DIAGNOSIS — M25572 Pain in left ankle and joints of left foot: Secondary | ICD-10-CM | POA: Diagnosis not present

## 2018-04-12 DIAGNOSIS — M25672 Stiffness of left ankle, not elsewhere classified: Secondary | ICD-10-CM

## 2018-04-12 DIAGNOSIS — R600 Localized edema: Secondary | ICD-10-CM

## 2018-04-12 DIAGNOSIS — R262 Difficulty in walking, not elsewhere classified: Secondary | ICD-10-CM

## 2018-04-12 NOTE — Therapy (Signed)
[facility] [address]  [place], [place], [address] Phone: [phone]   Fax:  [phone]  Physical Therapy Treatment  Patient Details  Name: Gabriella Campbell MRN: [id number] Date of Birth: [birth year] Referring Provider (PT): Jean Rosenthal, MD   Encounter Date: 04/12/2018  PT End of Session - 04/12/18 1744    Visit Number  14    Number of Visits  17    Date for PT Re-Evaluation  04/18/18    Authorization Type  BCBS    PT Start Time  1700    PT Stop Time  1752    PT Time Calculation (min)  52 min    Activity Tolerance  Patient tolerated treatment well;Patient limited by pain    Behavior During Therapy  [facility] for tasks assessed/performed       Past Medical History:  Diagnosis Date  . Guillain Barr syndrome [facility])     History reviewed. No pertinent surgical history.  There were no vitals filed for this visit.  Subjective Assessment - 04/12/18 1700    Subjective  Reports nothing much is new. Looking forward to getting this weekend off work.     Pertinent History  GBS    Diagnostic tests  02/15/18 L ankle xray: 3 views of the left ankle show a healed Weber a lateral malleolus fracture. The ankle mortise is intact. The soft tissue swelling is decreased when compared to previous injury films from October 2019.     Patient Stated Goals  100% recovery, full motion, being able to be on feet all day    Currently in Pain?  No/denies                       [facility] Adult PT Treatment/Exercise - 04/12/18 0001      Knee/Hip Exercises: Machines for Strengthening   Total Gym Leg Press  B LEs 25# x10    Other Machine  leg press B heel raise 10x 20#      Knee/Hip Exercises: Standing   Heel Raises  Both;1 set    Heel Raises Limitations  25x at counter top   increased reps d/t c/o pain and swelling with R wt shift   Hip Abduction  Right;10 reps;Knee straight;Stengthening    Abduction Limitations  3# at  counter top   cues to maintain trunk upright   Hip Extension  Right;10 reps;Stengthening;Knee straight    Extension Limitations  3# at counter top   good form     Knee/Hip Exercises: Supine   Bridges  Strengthening;Both;1 set;15 reps    Bridges Limitations  HS bridge    cues to bring heels out for added challenge   Other Supine Knee/Hip Exercises  Bridge + HS curl on orange pball x10   decreased hip elevation d/t difficulty     Knee/Hip Exercises: Sidelying   Clams  x10 green TB   cues to avoid rolling hips back     Knee/Hip Exercises: Prone   Other Prone Exercises  donkey kicks 10x each LE   cues to maintain knee bent     Cryotherapy   Number Minutes Cryotherapy  10 Minutes    Cryotherapy Location  Ankle   L   Type of Cryotherapy  Ice pack      Ankle Exercises: Aerobic   Nustep  L3 x 6 min LEs only      Ankle Exercises: Stretches   Gastroc Stretch  2 reps;30 seconds  Gastroc Stretch Limitations  prostretch at counter; on L      Ankle Exercises: Standing   Heel Raises Limitations  L LE standong on foam + R foor anterior/posterior stepping with intermittent UE support x15    Toe Walk (Round Trip)  at counter top- could not tolerate             PT Education - 04/12/18 1743    Education Details  update to HEP; administered green TB    Person(s) Educated  Patient    Methods  Explanation;Demonstration;Tactile cues;Verbal cues;Handout    Comprehension  Verbalized understanding;Returned demonstration       PT Short Term Goals - 03/05/18 1703      PT SHORT TERM GOAL #1   Title  Patient to be independnent with initial HEP.    Time  4    Period  Weeks    Status  Achieved        PT Long Term Goals - 03/26/18 1545      PT LONG TERM GOAL #1   Title  Patient to be independent with advanced HEP.    Time  8    Period  Weeks    Status  Partially Met      PT LONG TERM GOAL #2   Title  Patient to demonstrate L ankle AROM/PROM [facility] and without pain limiting.      Time  8    Period  Weeks    Status  Partially Met      PT LONG TERM GOAL #3   Title  Patient to demonstrate >=4+/5 strength in B LEs.    Time  8    Period  Weeks    Status  Partially Met      PT LONG TERM GOAL #4   Title  Patient to report tolerance of 4 hours of standing/walking in tennis shoe without pain limiting.     Time  8    Period  Weeks    Status  On-going   03/26/18: pt. noting 45 min standing/walking before needing to take a break due to rising L foot pain     PT LONG TERM GOAL #5   Title  Patient to report full day of work without pain or edema limiting.     Status  On-going            Plan - 04/12/18 1744    Clinical Impression Statement  Patient arrived to session with no new complaints. Tolerated hip strengthening ther-ex on mat with introduction of bridge with HS curl which patient perfumed with some difficulty. Good overall form with clamshells with banded resistance, intermittently required cues to avoid trunk rotation. Patient reporting increased swelling and ankle pain with B heel raise with slight L weight shift, thus advised patient to discontinue this exercise and instead increase her reps with B heel raises with equal weight shifts. Attempted toe walking along counter top which patient could not tolerate d/t pain, thus this was discontinued. Was able to perform heel raises on leg press machine with good form and tolerance. Patient reported mild pain in L ankle at end of session, thus she received ice pack to L ankle. Bruising over L anterior shin visible, patient reporting she has had this since the injury. No complaints at end of session.     Clinical Impairments Affecting Rehab Potential  GBS    PT Treatment/Interventions  ADLs/Self Care Home Management;Cryotherapy;Functional mobility training;Stair training;Gait training;DME Instruction;Moist Heat;Therapeutic activities;Therapeutic exercise;Balance training;Neuromuscular re-education;Patient/family  education;Orthotic Fit/Training;Passive range of motion;Manual techniques;Dry needling;Energy conservation;Splinting;Taping    PT Next Visit Plan  re-cert next session    Consulted and Agree with Plan of Care  Patient       Patient will benefit from skilled therapeutic intervention in order to improve the following deficits and impairments:  Decreased range of motion, Difficulty walking, Decreased activity tolerance, Pain, Decreased balance, Hypomobility, Impaired flexibility, Increased edema, Decreased strength  Visit Diagnosis: Pain in left ankle and joints of left foot  Stiffness of left ankle, not elsewhere classified  Muscle weakness (generalized)  Difficulty in walking, not elsewhere classified  Localized edema     Problem List There are no active problems to display for this patient.    [name], PT, DPT 04/12/18 6:01 PM   [facility] [address]  [place], [place], [address] Phone: [phone]   Fax:  [phone]  Name: Gabriella Campbell MRN: [id number] Date of Birth: Dec 12, 1955

## 2018-04-16 ENCOUNTER — Ambulatory Visit: Payer: BLUE CROSS/BLUE SHIELD | Admitting: Physical Therapy

## 2018-04-16 ENCOUNTER — Encounter: Payer: Self-pay | Admitting: Physical Therapy

## 2018-04-16 DIAGNOSIS — M6281 Muscle weakness (generalized): Secondary | ICD-10-CM

## 2018-04-16 DIAGNOSIS — M25572 Pain in left ankle and joints of left foot: Secondary | ICD-10-CM | POA: Diagnosis not present

## 2018-04-16 DIAGNOSIS — R262 Difficulty in walking, not elsewhere classified: Secondary | ICD-10-CM

## 2018-04-16 DIAGNOSIS — R600 Localized edema: Secondary | ICD-10-CM

## 2018-04-16 DIAGNOSIS — M25672 Stiffness of left ankle, not elsewhere classified: Secondary | ICD-10-CM

## 2018-04-16 NOTE — Therapy (Addendum)
[facility] [address]  [place], [place], [address] Phone: [phone]   Fax:  [phone]  Physical Therapy Treatment  Patient Details  Name: Gabriella Campbell MRN: [id number] Date of Birth: 03-04-1955 Referring Provider (PT): Jean Rosenthal, MD   Encounter Date: 04/16/2018  PT End of Session - 04/16/18 2505    Visit Number  15    Number of Visits  21    Date for PT Re-Evaluation  05/28/18    Authorization Type  BCBS    PT Start Time  1706    PT Stop Time  1744    PT Time Calculation (min)  38 min    Activity Tolerance  Patient tolerated treatment well    Behavior During Therapy  [facility] for tasks assessed/performed       Past Medical History:  Diagnosis Date  . Guillain Barr syndrome [facility])     History reviewed. No pertinent surgical history.  There were no vitals filed for this visit.  Subjective Assessment - 04/16/18 1707    Subjective  Reports that she is feeling okay- nothing new since last session. Overall reports 60% improvement in L ankle- ROM, strength, endurance. Would like to continue working on being able to walk without limping and without pain, endurance, balance.     Pertinent History  GBS    Diagnostic tests  02/15/18 L ankle xray: 3 views of the left ankle show a healed Weber a lateral malleolus fracture. The ankle mortise is intact. The soft tissue swelling is decreased when compared to previous injury films from October 2019.     Patient Stated Goals  100% recovery, full motion, being able to be on feet all day    Currently in Pain?  No/denies         [facility] PT Assessment - 04/16/18 0001      Assessment   Medical Diagnosis  Closed nondisplaced fx of lateral malleolus of L fibula    Referring Provider (PT)  Jean Rosenthal, MD    Onset Date/Surgical Date  11/18/17      Observation/Other Assessments   Focus on Therapeutic Outcomes (FOTO)   45% (55% limitation)      AROM   Left  Ankle Dorsiflexion  14    Left Ankle Plantar Flexion  51    Left Ankle Inversion  28    Left Ankle Eversion  20      PROM   Left Ankle Dorsiflexion  20    Left Ankle Plantar Flexion  55    Left Ankle Inversion  36    Left Ankle Eversion  25      Strength   Right Hip Flexion  4+/5    Right Hip ABduction  4+/5    Right Hip ADduction  4+/5    Left Hip Flexion  4+/5    Left Hip ABduction  4+/5    Left Hip ADduction  4+/5    Right Knee Flexion  4+/5    Right Knee Extension  4+/5    Left Knee Flexion  4/5    Left Knee Extension  4+/5    Right Ankle Dorsiflexion  4+/5    Right Ankle Plantar Flexion  5/5    Right Ankle Inversion  4+/5    Right Ankle Eversion  4+/5    Left Ankle Dorsiflexion  4/5    Left Ankle Plantar Flexion  3+/5    Left Ankle Inversion  4+/5    Left  Ankle Eversion  4+/5                   [facility] Adult PT Treatment/Exercise - 04/16/18 0001      Knee/Hip Exercises: Stretches   Passive Hamstring Stretch  Left;2 reps;30 seconds    Passive Hamstring Stretch Limitations  supine with strap   to relieve cramping     Knee/Hip Exercises: Seated   Hamstring Curl  Strengthening;Left;1 set;10 reps    Hamstring Limitations  blue TB- cues to decrease speed      Ankle Exercises: Aerobic   Nustep  L4 x 6 min LEs only      Ankle Exercises: Seated   Other Seated Ankle Exercises  L ankle 4 way ankle with green TB x20 each plane   cues to decrease speed            PT Education - 04/16/18 1748    Education Details  discussion on objective progress with PT and remaining impairments; update to HEP    Person(s) Educated  Patient    Methods  Explanation;Demonstration;Tactile cues;Verbal cues;Handout    Comprehension  Verbalized understanding;Returned demonstration       PT Short Term Goals - 04/16/18 1712      PT SHORT TERM GOAL #1   Title  Patient to be independnent with initial HEP.    Time  4    Period  Weeks    Status  Achieved        PT  Long Term Goals - 04/16/18 1712      PT LONG TERM GOAL #1   Title  Patient to be independent with advanced HEP.    Time  6    Period  Weeks    Status  Partially Met   met for current   Target Date  05/28/18      PT LONG TERM GOAL #2   Title  Patient to demonstrate L ankle AROM/PROM [facility] and without pain limiting.     Time  6    Period  Weeks    Status  Partially Met   L ankle PROM WFL and nonpainful, demonstrated L ankle DF and eversion AROM    Target Date  05/28/18      PT LONG TERM GOAL #3   Title  Patient to demonstrate >=4+/5 strength in B LEs.    Time  6    Period  Weeks    Status  Partially Met   still limited in L knee flexion, PF, and DF strength   Target Date  05/28/18      PT LONG TERM GOAL #4   Title  Patient to report tolerance of 4 hours of standing/walking in tennis shoe without pain limiting.     Time  6    Period  Weeks    Status  On-going   04/16/18: reports 30-60 min before an increase in L ankle baseline pain   Target Date  05/28/18      PT LONG TERM GOAL #5   Title  Patient to report full day of work without pain or edema limiting.     Time  6    Period  Weeks    Status  On-going   reports average of 3-4/10 pain at work, possibly rising to 8/10   Target Date  05/28/18      Additional Long Term Goals   Additional Long Term Goals  Yes      PT LONG TERM GOAL #6  Title  Patient to demonstrate L SLS for 15 sec on foam without LOB to decrease risk of falls.    Time  6    Period  Weeks    Status  New    Target Date  05/28/18            Plan - 04/16/18 1758    Clinical Impression Statement  Patient arrived to session with report of 60% improvement in L ankle since starting PT. Notes improvements in ROM, strength, and endurance, but would like to continue working on these factors as well as balance. L ankle PROM is WFL and nonpainful at this time. Patient demonstrated L ankle DF and eversion AROM improvement, still with limited PF and inversion  AROM. Patient showing good overall LW strength, still limited in L knee flexion, PF, and DF strength. Patient reports continued pain with ambulating at work, however is completely out of cam boot and ankle brace. Worked on ankle and hamstring strengthening ther-ex this session to address LE strength impairments. Updated HEP- patient reported understanding and with no complaints at end of session. Would benefit from skilled PT services 1x/week for 6 weeks to address remaining impairments.     Clinical Impairments Affecting Rehab Potential  GBS    PT Frequency  1x / week    PT Duration  6 weeks    PT Treatment/Interventions  ADLs/Self Care Home Management;Cryotherapy;Functional mobility training;Stair training;Gait training;DME Instruction;Moist Heat;Therapeutic activities;Therapeutic exercise;Balance training;Neuromuscular re-education;Patient/family education;Orthotic Fit/Training;Passive range of motion;Manual techniques;Dry needling;Energy conservation;Splinting;Taping;Iontophoresis 65m/ml Dexamethasone;Electrical Stimulation;Vasopneumatic Device;Ultrasound    PT Next Visit Plan  HEP consolidation    Consulted and Agree with Plan of Care  Patient       Patient will benefit from skilled therapeutic intervention in order to improve the following deficits and impairments:  Decreased range of motion, Difficulty walking, Decreased activity tolerance, Pain, Decreased balance, Hypomobility, Impaired flexibility, Increased edema, Decreased strength  Visit Diagnosis: Pain in left ankle and joints of left foot  Stiffness of left ankle, not elsewhere classified  Muscle weakness (generalized)  Difficulty in walking, not elsewhere classified  Localized edema     Problem List There are no active problems to display for this patient.    YJanene Harvey PT, DPT 04/16/18 6:07 PM   [facility] [address] [place]  [address]Phone: [phone]  Fax:  [phone] Name: Gabriella LisbonMRN: [id number] of Birth: [birth year]  PHYSICAL THERAPY DISCHARGE SUMMARY  Visits from Start of Care: 15  Current functional level related to goals / functional outcomes: Unable to assess- patient did not return since last session d/t concern about COVID-19   Remaining deficits: Unable to assess   Education / Equipment: HEP  Plan: Patient agrees to discharge.  Patient goals were partially met. Patient is being discharged due to not returning since the last visit.  ?????     YJanene Harvey PT, DPT 06/05/18 1:34 PM

## 2018-04-25 ENCOUNTER — Ambulatory Visit: Payer: BLUE CROSS/BLUE SHIELD | Admitting: Physical Therapy

## 2018-04-30 ENCOUNTER — Other Ambulatory Visit: Payer: BLUE CROSS/BLUE SHIELD | Admitting: Orthotics

## 2018-05-02 ENCOUNTER — Encounter: Payer: BLUE CROSS/BLUE SHIELD | Admitting: Physical Therapy

## 2018-05-08 ENCOUNTER — Other Ambulatory Visit: Payer: Self-pay | Admitting: Urology

## 2018-05-08 NOTE — Patient Instructions (Addendum)
Gabriella Campbell   Your procedure is scheduled on:   05/11/2018  Report to [facility] Main  Entrance  Report to admitting at 12:00 PM  Family must wait outside of hospital or be called by MD after surgery    Call this number if you have problems the morning of surgery [phone]    BRUSH YOUR TEETH MORNING OF SURGERY AND RINSE YOUR MOUTH OUT, NO CHEWING GUM CANDY OR MINTS.  NO SOLID FOOD AFTER MIDNIGHT THE NIGHT PRIOR TO SURGERY. NOTHING BY MOUTH EXCEPT CLEAR LIQUIDS UNTIL 6  HOURS PRIOR TO SCHEULED SURGERY. Clear liquids until 8:00 am  CLEAR LIQUID DIET   Foods Allowed                                                                     Foods Excluded  Coffee and tea( no cream,) regular and decaf                             liquids that you cannot  Plain Jell-O in any flavor                                             see through such as: Fruit ices (not with fruit pulp)                                     milk, soups, orange juice  Iced Popsicles                                    All solid food Carbonated beverages, regular and diet                                    Cranberry, grape and apple juices Sports drinks like Gatorade    Take these medicines the morning of surgery with A SIP OF WATER: none                                You may not have any metal on your body including hair pins and              piercings  Do not wear jewelry, make-up, lotions, powders or perfumes, deodorant             Do not wear nail polish.  Do not shave  48 hours prior to surgery.     Do not bring valuables to the hospital.  IS NOT             RESPONSIBLE   FOR VALUABLES.  Contacts, dentures or bridgework may not be worn into surgery.  .     Patients discharged the day of surgery will not be  allowed to drive home.  IF YOU ARE HAVING SURGERY AND GOING HOME THE SAME DAY, YOU MUST HAVE AN ADULT TO DRIVE YOU HOME AND BE WITH YOU FOR 24 HOURS.  YOU MAY GO HOME BY  TAXI OR UBER OR ORTHERWISE, BUT AN ADULT MUST ACCOMPANY YOU HOME AND STAY WITH YOU FOR 24 HOURS .  Name and phone number of your driver:Dellia Nims [phone]   Special Instructions: N/A              Please read over the following fact sheets you were given: _____________________________________________________________________               [facility] - Preparing for Surgery Before surgery, you can play an important role.  Because skin is not sterile, your skin needs to be as free of germs as possible.  You can reduce the number of germs on your skin by washing with CHG (chlorahexidine gluconate) soap before surgery.   CHG is an antiseptic cleaner which kills germs and bonds with the skin to continue killing germs even after washing. Please DO NOT use if you have an allergy to CHG or antibacterial soaps.  If your skin becomes reddened/irritated stop using the CHG and inform your nurse when you arrive at Short Stay. Do not shave (including legs and underarms) for at least 48 hours prior to the first CHG shower.  You may shave your face/neck. Please follow these instructions carefully:   1.  Shower with CHG Soap the night before surgery and the  morning of Surgery.   2.  If you choose to wash your hair, wash your hair first as usual with your  normal  Shampoo.   3.  After you shampoo, rinse your hair and body thoroughly to remove the  shampoo.                           4.  Use CHG as you would any other liquid soap.  You can apply chg directly  to the skin and wash                       Gently with a scrungie or clean washcloth.   5.  Apply the CHG Soap to your body ONLY FROM THE NECK DOWN.   Do not use on face/ open                           Wound or open sores. Avoid contact with eyes, ears mouth and genitals (private parts).                       Wash face,  Genitals (private parts) with your normal soap.              6.  Wash thoroughly, paying special attention to the area  where your surgery  will be performed.   7.  Thoroughly rinse your body with warm water from the neck down.   8.  DO NOT shower/wash with your normal soap after using and rinsing off  the CHG Soap.                9.  Pat yourself dry with a clean towel.             10.  Wear clean pajamas.  11.  Place clean sheets on your bed the night of your first shower and do not  sleep with pets. Day of Surgery : Do not apply any lotions/deodorants the morning of surgery.  Please wear clean clothes to the hospital/surgery center.  FAILURE TO FOLLOW THESE INSTRUCTIONS MAY RESULT IN THE CANCELLATION OF YOUR SURGERY PATIENT SIGNATURE_________________________________  NURSE SIGNATURE__________________________________  ________________________________________________________________________

## 2018-05-09 ENCOUNTER — Other Ambulatory Visit: Payer: Self-pay

## 2018-05-09 ENCOUNTER — Encounter (HOSPITAL_COMMUNITY)
Admission: RE | Admit: 2018-05-09 | Discharge: 2018-05-09 | Disposition: A | Discharge status: Home / Self Care | Payer: BLUE CROSS/BLUE SHIELD | Source: Ambulatory Visit | Attending: Urology | Admitting: Urology

## 2018-05-09 ENCOUNTER — Encounter (HOSPITAL_COMMUNITY): Payer: Self-pay

## 2018-05-09 ENCOUNTER — Encounter: Payer: BLUE CROSS/BLUE SHIELD | Admitting: Physical Therapy

## 2018-05-09 DIAGNOSIS — Z01812 Encounter for preprocedural laboratory examination: Secondary | ICD-10-CM | POA: Diagnosis not present

## 2018-05-09 HISTORY — DX: Acute embolism and thrombosis of unspecified axillary vein: I82.A19

## 2018-05-09 HISTORY — DX: Personal history of urinary calculi: Z87.442

## 2018-05-09 LAB — CBC
HCT: 40.6 % (ref 36.0–46.0)
Hemoglobin: 13 g/dL (ref 12.0–15.0)
MCH: 28.1 pg (ref 26.0–34.0)
MCHC: 32 g/dL (ref 30.0–36.0)
MCV: 87.9 fL (ref 80.0–100.0)
Platelets: 354 10*3/uL (ref 150–400)
RBC: 4.62 MIL/uL (ref 3.87–5.11)
RDW: 13.4 % (ref 11.5–15.5)
WBC: 7.5 10*3/uL (ref 4.0–10.5)
nRBC: 0 % (ref 0.0–0.2)

## 2018-05-10 MED ORDER — GENTAMICIN SULFATE 40 MG/ML IJ SOLN
5.0000 mg/kg | INTRAVENOUS | Status: AC
  Administered 2018-05-11: 384.4 mg via INTRAVENOUS
  Filled 2018-05-10: qty 9.5

## 2018-05-10 NOTE — Anesthesia Preprocedure Evaluation (Addendum)
Anesthesia Evaluation  Patient identified by MRN, date of birth, ID band Patient awake    Reviewed: Allergy & Precautions, NPO status , Patient's Chart, lab work & pertinent test results  Airway Mallampati: II  TM Distance: >3 FB Neck ROM: Full    Dental  (+) Teeth Intact, Dental Advisory Given   Pulmonary    breath sounds clear to auscultation       Cardiovascular  Rhythm:Regular Rate:Normal     Neuro/Psych    GI/Hepatic   Endo/Other    Renal/GU      Musculoskeletal   Abdominal   Peds  Hematology   Anesthesia Other Findings   Reproductive/Obstetrics                             Anesthesia Physical Anesthesia Plan  ASA: II  Anesthesia Plan: General   Post-op Pain Management:    Induction: Intravenous  PONV Risk Score and Plan: Ondansetron and Dexamethasone  Airway Management Planned: LMA  Additional Equipment:   Intra-op Plan:   Post-operative Plan:   Informed Consent: I have reviewed the patients History and Physical, chart, labs and discussed the procedure including the risks, benefits and alternatives for the proposed anesthesia with the patient or authorized representative who has indicated his/her understanding and acceptance.     Dental advisory given  Plan Discussed with: CRNA and Anesthesiologist  Anesthesia Plan Comments: (See PAT note 05/09/18, Jodell Cipro, PA-C)      Anesthesia Quick Evaluation

## 2018-05-10 NOTE — Progress Notes (Signed)
Anesthesia Chart Review   Case:  [id number] Date/Time:  05/11/18 1345   Procedures:      CYSTOSCOPY WITH RETROGRADE PYELOGRAM, URETEROSCOPY AND STENT PLACEMENT (Left ) - 1 HR     CYSTOSCOPY WITH LITHOLAPAXY (N/A )     HOLMIUM LASER APPLICATION (Left )   Anesthesia type:  General   Pre-op diagnosis:  LEFT DISTAL URETERAL STONE, BLADDER STONE   Location:  [facility] ROOM 01 / [facility]   Surgeon:  Sebastian Ache, MD      DISCUSSION:63 yo never smoker with h/o DVT 10/19 after fibula fx (resolution on follow up ultrasound, anticoagulation discontinued), h/o GBS 1990, left ureteral stone, bladder stone scheduled for above procedure 05/11/18 with Dr. Sebastian Ache.   Pt can proceed with planned procedure barring acute status change.  VS: BP (!) 155/84   Pulse 88   Temp (!) 36.3 C (Oral)   Resp 20   Ht 5' 6.5" (1.689 m)   Wt 101.4 kg   LMP  (Within Years) Comment: last LMP 2014  SpO2 100%   BMI 35.55 kg/m   PROVIDERS: Charna Elizabeth, MD is PCP   Kalman Shan, MD with Pulmonary Disease LABS: Labs reviewed: Acceptable for surgery. (all labs ordered are listed, but only abnormal results are displayed)  Labs Reviewed  CBC     IMAGES:   EKG:   CV:  Past Medical History:  Diagnosis Date  . DVT of axillary vein, acute (HCC) 11/2017   after fx of lt fibula . no anticoagulants since 04/2018  . Guillain Barr syndrome (HCC) 1990   none  . History of kidney stones     Past Surgical History:  Procedure Laterality Date  . COLONOSCOPY WITH ESOPHAGOGASTRODUODENOSCOPY (EGD)    . mole removed from Rt leg  age 76    MEDICATIONS: . nitrofurantoin, macrocrystal-monohydrate, (MACROBID) 100 MG capsule   No current facility-administered medications for this encounter.     Janey Genta [facility] Pre-Surgical Testing [phone] 05/10/18 10:41 AM

## 2018-05-11 ENCOUNTER — Encounter (HOSPITAL_COMMUNITY): Payer: Self-pay | Admitting: *Deleted

## 2018-05-11 ENCOUNTER — Ambulatory Visit (HOSPITAL_COMMUNITY): Payer: BLUE CROSS/BLUE SHIELD

## 2018-05-11 ENCOUNTER — Other Ambulatory Visit: Payer: Self-pay

## 2018-05-11 ENCOUNTER — Encounter (HOSPITAL_COMMUNITY)
Admission: RE | Disposition: A | Discharge status: Home / Self Care | Payer: Self-pay | Source: Home / Self Care | Attending: Urology

## 2018-05-11 ENCOUNTER — Ambulatory Visit (HOSPITAL_COMMUNITY): Payer: BLUE CROSS/BLUE SHIELD | Admitting: Physician Assistant

## 2018-05-11 ENCOUNTER — Ambulatory Visit (HOSPITAL_COMMUNITY)
Admission: RE | Admit: 2018-05-11 | Discharge: 2018-05-11 | Disposition: A | Discharge status: Home / Self Care | Payer: BLUE CROSS/BLUE SHIELD | Attending: Urology | Admitting: Urology

## 2018-05-11 ENCOUNTER — Ambulatory Visit (HOSPITAL_COMMUNITY): Payer: BLUE CROSS/BLUE SHIELD | Admitting: Anesthesiology

## 2018-05-11 DIAGNOSIS — E669 Obesity, unspecified: Secondary | ICD-10-CM | POA: Insufficient documentation

## 2018-05-11 DIAGNOSIS — N210 Calculus in bladder: Secondary | ICD-10-CM | POA: Diagnosis present

## 2018-05-11 DIAGNOSIS — N201 Calculus of ureter: Secondary | ICD-10-CM | POA: Diagnosis not present

## 2018-05-11 DIAGNOSIS — Q6231 Congenital ureterocele, orthotopic: Secondary | ICD-10-CM | POA: Diagnosis not present

## 2018-05-11 DIAGNOSIS — Z87442 Personal history of urinary calculi: Secondary | ICD-10-CM | POA: Insufficient documentation

## 2018-05-11 DIAGNOSIS — Z86718 Personal history of other venous thrombosis and embolism: Secondary | ICD-10-CM | POA: Insufficient documentation

## 2018-05-11 DIAGNOSIS — Z91013 Allergy to seafood: Secondary | ICD-10-CM | POA: Insufficient documentation

## 2018-05-11 DIAGNOSIS — Z91018 Allergy to other foods: Secondary | ICD-10-CM | POA: Diagnosis not present

## 2018-05-11 DIAGNOSIS — Z880 Allergy status to penicillin: Secondary | ICD-10-CM | POA: Insufficient documentation

## 2018-05-11 HISTORY — PX: CYSTOSCOPY WITH RETROGRADE PYELOGRAM, URETEROSCOPY AND STENT PLACEMENT: SHX5789

## 2018-05-11 HISTORY — PX: HOLMIUM LASER APPLICATION: SHX5852

## 2018-05-11 SURGERY — CYSTOURETEROSCOPY, WITH RETROGRADE PYELOGRAM AND STENT INSERTION
Anesthesia: Monitor Anesthesia Care

## 2018-05-11 MED ORDER — SODIUM CHLORIDE 0.9 % IR SOLN
Status: DC | PRN
  Administered 2018-05-11: 3000 mL via INTRAVESICAL

## 2018-05-11 MED ORDER — PROPOFOL 500 MG/50ML IV EMUL
INTRAVENOUS | Status: DC | PRN
  Administered 2018-05-11: 125 ug/kg/min via INTRAVENOUS

## 2018-05-11 MED ORDER — PROPOFOL 10 MG/ML IV BOLUS
INTRAVENOUS | Status: AC
  Filled 2018-05-11: qty 20

## 2018-05-11 MED ORDER — DIATRIZOATE MEGLUMINE 30 % UR SOLN
URETHRAL | Status: AC
  Filled 2018-05-11: qty 100

## 2018-05-11 MED ORDER — DEXAMETHASONE SODIUM PHOSPHATE 10 MG/ML IJ SOLN
INTRAMUSCULAR | Status: AC
  Filled 2018-05-11: qty 1

## 2018-05-11 MED ORDER — SENNOSIDES-DOCUSATE SODIUM 8.6-50 MG PO TABS: 1.0000 | ORAL_TABLET | Freq: Two times a day (BID) | ORAL | 0 refills | Status: DC

## 2018-05-11 MED ORDER — GENTAMICIN SULFATE 40 MG/ML IJ SOLN: INTRAVENOUS | Status: DC | PRN

## 2018-05-11 MED ORDER — FENTANYL CITRATE (PF) 100 MCG/2ML IJ SOLN: 25.0000 ug | INTRAMUSCULAR | Status: DC | PRN

## 2018-05-11 MED ORDER — 0.9 % SODIUM CHLORIDE (POUR BTL) OPTIME
TOPICAL | Status: DC | PRN
  Administered 2018-05-11: 1000 mL

## 2018-05-11 MED ORDER — DEXAMETHASONE SODIUM PHOSPHATE 10 MG/ML IJ SOLN
INTRAMUSCULAR | Status: DC | PRN
  Administered 2018-05-11: 4 mg via INTRAVENOUS

## 2018-05-11 MED ORDER — PROPOFOL 10 MG/ML IV BOLUS
INTRAVENOUS | Status: DC | PRN
  Administered 2018-05-11: 100 mg via INTRAVENOUS

## 2018-05-11 MED ORDER — ONDANSETRON HCL 4 MG/2ML IJ SOLN: 4.0000 mg | Freq: Once | INTRAMUSCULAR | Status: DC | PRN

## 2018-05-11 MED ORDER — ONDANSETRON HCL 4 MG/2ML IJ SOLN
INTRAMUSCULAR | Status: DC | PRN
  Administered 2018-05-11: 4 mg via INTRAVENOUS

## 2018-05-11 MED ORDER — OXYCODONE HCL 5 MG/5ML PO SOLN: 5.0000 mg | Freq: Once | ORAL | Status: DC | PRN

## 2018-05-11 MED ORDER — LACTATED RINGERS IV SOLN
INTRAVENOUS | Status: DC
  Administered 2018-05-11: 13:00:00 via INTRAVENOUS

## 2018-05-11 MED ORDER — NITROFURANTOIN MONOHYD MACRO 100 MG PO CAPS: 100.0000 mg | ORAL_CAPSULE | Freq: Two times a day (BID) | ORAL | 0 refills | Status: AC

## 2018-05-11 MED ORDER — ONDANSETRON HCL 4 MG/2ML IJ SOLN
INTRAMUSCULAR | Status: AC
  Filled 2018-05-11: qty 2

## 2018-05-11 MED ORDER — KETOROLAC TROMETHAMINE 10 MG PO TABS: 10.0000 mg | ORAL_TABLET | Freq: Four times a day (QID) | ORAL | 0 refills | Status: DC | PRN

## 2018-05-11 MED ORDER — OXYCODONE-ACETAMINOPHEN 5-325 MG PO TABS: 1.0000 | ORAL_TABLET | Freq: Four times a day (QID) | ORAL | 0 refills | Status: DC | PRN

## 2018-05-11 MED ORDER — OXYCODONE HCL 5 MG PO TABS: 5.0000 mg | ORAL_TABLET | Freq: Once | ORAL | Status: DC | PRN

## 2018-05-11 SURGICAL SUPPLY — 27 items
BAG URO CATCHER STRL LF (MISCELLANEOUS) ×4 IMPLANT
BASKET LASER NITINOL 1.9FR (BASKET) IMPLANT
CATH INTERMIT  6FR 70CM (CATHETERS) ×4 IMPLANT
CLOTH BEACON ORANGE TIMEOUT ST (SAFETY) IMPLANT
COVER SURGICAL LIGHT HANDLE (MISCELLANEOUS) IMPLANT
COVER WAND RF STERILE (DRAPES) IMPLANT
EXTRACTOR STONE 1.7FRX115CM (UROLOGICAL SUPPLIES) IMPLANT
FIBER LASER FLEXIVA 1000 (UROLOGICAL SUPPLIES) IMPLANT
FIBER LASER FLEXIVA 365 (UROLOGICAL SUPPLIES) IMPLANT
FIBER LASER FLEXIVA 550 (UROLOGICAL SUPPLIES) IMPLANT
FIBER LASER TRAC TIP (UROLOGICAL SUPPLIES) IMPLANT
GLOVE BIOGEL M STRL SZ7.5 (GLOVE) ×4 IMPLANT
GOWN STRL REUS W/TWL LRG LVL3 (GOWN DISPOSABLE) ×4 IMPLANT
GUIDEWIRE ANG ZIPWIRE 038X150 (WIRE) ×4 IMPLANT
GUIDEWIRE STR DUAL SENSOR (WIRE) ×4 IMPLANT
IV NS 1000ML (IV SOLUTION)
IV NS 1000ML BAXH (IV SOLUTION) IMPLANT
KIT TURNOVER KIT A (KITS) IMPLANT
MANIFOLD NEPTUNE II (INSTRUMENTS) ×4 IMPLANT
PACK CYSTO (CUSTOM PROCEDURE TRAY) ×4 IMPLANT
SHEATH URETERAL 12FRX28CM (UROLOGICAL SUPPLIES) IMPLANT
SHEATH URETERAL 12FRX35CM (MISCELLANEOUS) IMPLANT
STENT URET 6FRX22 CONTOUR (STENTS) ×4 IMPLANT
SYR CONTROL 10ML LL (SYRINGE) IMPLANT
TUBE FEEDING 8FR 16IN STR KANG (MISCELLANEOUS) ×4 IMPLANT
TUBING CONNECTING 10 (TUBING) ×3 IMPLANT
TUBING CONNECTING 10' (TUBING) ×1

## 2018-05-11 NOTE — Anesthesia Procedure Notes (Signed)
Procedure Name: MAC Date/Time: 05/11/2018 2:40 PM Performed by: Niel Hummer, CRNA Pre-anesthesia Checklist: Patient identified, Emergency Drugs available, Suction available and Patient being monitored Patient Re-evaluated:Patient Re-evaluated prior to induction Oxygen Delivery Method: Simple face mask

## 2018-05-11 NOTE — Discharge Instructions (Signed)
1 - You may have urinary urgency (bladder spasms) and bloody urine on / off with stent in place. This is normal. ° °2 - Call MD or go to ER for fever >102, severe pain / nausea / vomiting not relieved by medications, or acute change in medical status ° °

## 2018-05-11 NOTE — Transfer of Care (Signed)
Immediate Anesthesia Transfer of Care Note  Patient: Gabriella Campbell  Procedure(s) Performed: CYSTOSCOPY WITH URETEROSCOPY WITH STONE EXTRACTION, AND STENT PLACEMENT (Left ) HOLMIUM LASER APPLICATION OF URETEROCELE (Left )  Patient Location: PACU  Anesthesia Type:General  Level of Consciousness: awake, alert  and oriented  Airway & Oxygen Therapy: Patient Spontanous Breathing and Patient connected to face mask oxygen  Post-op Assessment: Report given to RN and Post -op Vital signs reviewed and stable  Post vital signs: Reviewed and stable  Last Vitals:  Vitals Value Taken Time  BP 122/74 05/11/2018  3:30 PM  Temp 36.8 C 05/11/2018  3:28 PM  Pulse 75 05/11/2018  3:31 PM  Resp 12 05/11/2018  3:31 PM  SpO2 100 % 05/11/2018  3:31 PM  Vitals shown include unvalidated device data.  Last Pain:  Vitals:   05/11/18 1229  TempSrc:   PainSc: 0-No pain         Complications: No apparent anesthesia complications

## 2018-05-11 NOTE — Op Note (Signed)
NAME: Gabriella Campbell, Gabriella Campbell MEDICAL RECORD PI:[id number] ACCOUNT [id number] DATE OF BIRTH:10-08-1955 FACILITY: [facility] LOCATION: [facility] PHYSICIAN:Sanae Willetts, MD  OPERATIVE REPORT  DATE OF PROCEDURE:  05/11/2018  PREOPERATIVE DIAGNOSIS:  Left distal ureteral stone versus bladder stone.  POSTOPERATIVE DIAGNOSIS:  Left ureterocele with stone.  PROCEDURE: 1.  Cystoscopy with laser incision of left ureterocele with basketing of stone, ureteroscopy. 2.  Placement of left ureteral stent 5 x 22 Contour, no tether.  ESTIMATED BLOOD LOSS:  Nil.  COMPLICATIONS:  None.  SPECIMENS:   1.  Left distal ureteral stone given to patient.  FINDINGS: 1.  Left distal ureterocele approximately 1.5 cm diameter containing distal stone. 2.  Successful ablation of ureterocele with removal of stone. 3.  Successful placement of left ureteral stent proximal end was in the renal pelvis, distal end in urinary bladder.  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  This was diagnosed when she was working in [place].  She has since relocated to [facility].  She was originally scheduled  to have a surgery for this; however, she unfortunately developed a compound leg fracture complicated by DVT.  Her colic was fairly well controlled at the time.  Her stone surgery was postponed.  She represented with significant lower urinary tract  symptoms and irritative voiding.  She failed to pass the stone.  Repeat imaging revealed slight interval growth of the stone in the same location and concerning for left distal ureteral stone versus bladder stone versus stone and ureterocele.  Given the  size of the stone and that even a partial obstruction would be detrimental to the ipsilateral renal unit, it was clearly felt that surgical care would be warranted with ureteroscopy versus cystolitholapaxy and she wished to proceed.  Informed consent  was  obtained and placed in medical record.  DESCRIPTION OF PROCEDURE:  The patient being Gabriella Campbell, procedure being left ureteroscopic stimulation versus cystolithotomy was confirmed.  Procedure timeout was performed.  Antibiotics administered.  General LMA anesthesia.  Initially MAC anesthesia  introduced.  The patient was placed into a medium lithotomy position.  Sterile field was created prepping and draping the vagina, introitus and proximal thighs using noniodinated prep.  Cystourethroscopy was performed using a 20-French rigid cystoscope  with offset lens.  Inspection of bladder revealed no diverticula, calcifications or lesions.  The left distal intramural ureter had a bullous edematous look to it consistent with a ureterocele.  The true ureteral orifice was punctate and at the anterior  aspect of this.  This was successfully cannulated with an angle-tipped Glidewire to the level of the presumed renal pelvis.  Retrograde pyelography was not performed as this went very smoothly and the patient has a very classic reaction to contrast.  A  semi-rigid ureteroscopy was then performed of the distal most ureter alongside a sensor working wire.  This did reveal in fact a ureterocele with distal stone and the stone was much too large for simple basketing.  The stone was likely secondary to  ureterocele and then definitive management would be laser incision and treatment ureterocele with removal of stone.  As such, holmium laser energy was applied to the ureterocele performing an incision of it, further extending the inferior aspect of the  ureteral orifice to the level of the trigone with a 280 laser fiber using settings of 1 joule and 10 Hz in an ablating type fashion, which revealed an excellent incision and creation of  wide-mouth neo-ureteral orifice.  The stone was then basketed with  an escape basket, removed completely intact and set aside.  Repeat semirigid ureteroscopy of the distal ureter revealed no  distal calcifications.  There was excellent hemostasis.  The ureteral stone mouth was now wide.  Given the incision technique, it  was felt that interval stenting without tether would be warranted to prevent early stricturing and a new 6 x 22 contour-type stent was placed using cystoscopic and fluoroscopic guidance.  Good proximal and distal plane were noted.  Bladder was emptied per cystoscope procedure terminated.    The patient tolerated the procedure well.  No immediate complications.  The patient was taken to [facility] in stable condition with plan for discharge home.  AN/NUANCE  D:05/11/2018 T:05/11/2018 JOB:[id number]

## 2018-05-11 NOTE — Anesthesia Procedure Notes (Signed)
Procedure Name: LMA Insertion Date/Time: 05/11/2018 3:00 PM Performed by: Nelle Don, CRNA Pre-anesthesia Checklist: Patient identified, Emergency Drugs available, Suction available and Patient being monitored Patient Re-evaluated:Patient Re-evaluated prior to induction Oxygen Delivery Method: Circle system utilized Preoxygenation: Pre-oxygenation with 100% oxygen Induction Type: IV induction LMA: LMA inserted LMA Size: 4.0 Dental Injury: Teeth and Oropharynx as per pre-operative assessment

## 2018-05-11 NOTE — H&P (Signed)
Gabriella Campbell is an 63 y.o. female.    Chief Complaint: Pre-OP LEFT Ureteroscopic stone manipulation v. Cystolithalopexy  HPI:   1 - Distal Ureteral v. Bladder Stone - 1.1 cm suspect bladder stone by CT report done in [facility] 07/2017. My review suggests distal ureteral UVJ. Declines known passage. She admits to worsening sporadic irritative voiding. Was planning on operative cystolithalopexy late 2019 but not performed as she had tib-fib fracture complicated by DVT all of which has now resolved. CT 04/2018 with persistant left stone with mild hydro.    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 NO ischemic CV disease / blood thinners. 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   Today "Gabriella Campbell" is seen to proceed with LEFT ureteroscopic stone manipulation v. cystolithalopexy fo rpersistnat distal stone with increasing local symptoms. Most recetn UCX negative.   Past Medical History:  Diagnosis Date  . DVT of axillary vein, acute (HCC) 11/2017   after fx of lt fibula . no anticoagulants since 04/2018  . Guillain Barr syndrome (HCC) 1990   none  . History of kidney stones     Past Surgical History:  Procedure Laterality Date  . COLONOSCOPY WITH ESOPHAGOGASTRODUODENOSCOPY (EGD)    . mole removed from Rt leg  age 35    No family history on file. Social History:  reports that she has never smoked. She has never used smokeless tobacco. She reports previous alcohol use. She reports previous drug use.  Allergies:  Allergies  Allergen Reactions  . Penicillins Other (See Comments)    Did it involve swelling of the face/tongue/throat, SOB, or low BP? Unknown Did it involve sudden or severe rash/hives, skin peeling, or any reaction on the inside of your mouth or nose? Unknown Did you need to seek medical attention at a hospital or doctor's office? Unknown When did it last happen? Childhood reaction. If all above answers are "NO", may proceed  with cephalosporin use.   Lysbeth Penner Flavor     Fruit swelling of lips  . Other     Cat Dander, and MSG flash and diarrhea  . Shellfish Allergy     Swelling bronchospasm    No medications prior to admission.    Results for orders placed or performed during the hospital encounter of 05/09/18 (from the past 48 hour(s))  CBC     Status: None   Collection Time: 05/09/18 11:01 AM  Result Value Ref Range   WBC 7.5 4.0 - 10.5 K/uL   RBC 4.62 3.87 - 5.11 MIL/uL   Hemoglobin 13.0 12.0 - 15.0 g/dL   HCT 73.7 10.6 - 26.9 %   MCV 87.9 80.0 - 100.0 fL   MCH 28.1 26.0 - 34.0 pg   MCHC 32.0 30.0 - 36.0 g/dL   RDW 48.5 46.2 - 70.3 %   Platelets 354 150 - 400 K/uL   nRBC 0.0 0.0 - 0.2 %    Comment: Performed at [facility], [address] [place], [place] [address]   No results found.  Review of Systems  Constitutional: Negative.  Negative for chills and fever.  HENT: Negative.   Eyes: Negative.   Respiratory: Negative.   Cardiovascular: Negative.   Gastrointestinal: Negative.   Genitourinary: Positive for dysuria, frequency, hematuria and urgency.  Musculoskeletal: Negative.   Skin: Negative.   Neurological: Negative.   Endo/Heme/Allergies: Negative.   Psychiatric/Behavioral: Negative.     There were no vitals taken for  this visit. Physical Exam  Constitutional: She appears well-developed.  HENT:  Head: Normocephalic.  Neck: Normal range of motion.  Cardiovascular: Normal rate.  Respiratory: Effort normal.  GI:  Stable moderate truncal obesity.   Genitourinary:    Genitourinary Comments: Mild left CVAT.    Musculoskeletal: Normal range of motion.  Neurological: She is alert.  Skin: Skin is warm.  Psychiatric: She has a normal mood and affect.     Assessment/Plan  Proceed as planned with LEFT ureteroscopic stone manipulation v. cystolithalopexy for persistant stone. Her left renal unit has been obstructed for sometime and I feel must be decompressed.    Sebastian Ache, MD 05/11/2018, 8:03 AM

## 2018-05-11 NOTE — Brief Op Note (Signed)
05/11/2018  3:15 PM  PATIENT:  Gabriella Campbell  63 y.o. female  PRE-OPERATIVE DIAGNOSIS:  LEFT DISTAL URETEROCELE WITH STONE  POST-OPERATIVE DIAGNOSIS:  LEFT DISTAL URETEROCELE WITH STONE  PROCEDURE:  Procedure(s) with comments: CYSTOSCOPY WITH URETEROSCOPY WITH STONE EXTRACTION, AND STENT PLACEMENT (Left) - 1 HR HOLMIUM LASER APPLICATION OF URETEROCELE (Left)  SURGEON:  Surgeon(s) and Role:    Sebastian Ache, MD - Primary  PHYSICIAN ASSISTANT:   ASSISTANTS: none   ANESTHESIA:   general  EBL: minimal   BLOOD ADMINISTERED:none  DRAINS: none   LOCAL MEDICATIONS USED:  NONE  SPECIMEN:  Source of Specimen:  left distal ureteral stone   DISPOSITION OF SPECIMEN:  given to patient  COUNTS:  YES  TOURNIQUET:  * No tourniquets in log *  DICTATION: .Other Dictation: Dictation Number [id number]  PLAN OF CARE: Discharge to home after PACU  PATIENT DISPOSITION:  PACU - hemodynamically stable.   Delay start of Pharmacological VTE agent (>24hrs) due to surgical blood loss or risk of bleeding: yes

## 2018-05-12 NOTE — Anesthesia Postprocedure Evaluation (Signed)
Anesthesia Post Note  Patient: Gabriella Campbell  Procedure(s) Performed: CYSTOSCOPY WITH URETEROSCOPY WITH STONE EXTRACTION, AND STENT PLACEMENT (Left ) HOLMIUM LASER APPLICATION OF URETEROCELE (Left )     Patient location during evaluation: PACU Anesthesia Type: MAC Level of consciousness: awake and alert Pain management: pain level controlled Vital Signs Assessment: post-procedure vital signs reviewed and stable Respiratory status: spontaneous breathing, nonlabored ventilation, respiratory function stable and patient connected to nasal cannula oxygen Cardiovascular status: blood pressure returned to baseline and stable Postop Assessment: no apparent nausea or vomiting Anesthetic complications: no    Last Vitals:  Vitals:   05/11/18 1600 05/11/18 1615  BP: 137/77 (!) 150/66  Pulse: 77 76  Resp: 12 16  Temp: 36.8 C 36.8 C  SpO2: 100% 100%    Last Pain:  Vitals:   05/11/18 1615  TempSrc:   PainSc: 0-No pain                 Adalida Garver COKER

## 2018-05-15 NOTE — Addendum Note (Signed)
Addendum  created 05/15/18 1712 by Kipp Brood, MD   Clinical Note Signed

## 2018-05-16 ENCOUNTER — Encounter (HOSPITAL_COMMUNITY): Payer: Self-pay | Admitting: Urology

## 2018-05-30 ENCOUNTER — Encounter: Payer: BLUE CROSS/BLUE SHIELD | Admitting: Physical Therapy

## 2018-06-11 ENCOUNTER — Other Ambulatory Visit: Payer: BLUE CROSS/BLUE SHIELD | Admitting: Orthotics

## 2018-06-12 ENCOUNTER — Other Ambulatory Visit: Payer: Self-pay | Admitting: Gastroenterology

## 2018-06-12 DIAGNOSIS — R1011 Right upper quadrant pain: Secondary | ICD-10-CM

## 2018-06-20 ENCOUNTER — Ambulatory Visit
Admission: RE | Admit: 2018-06-20 | Discharge: 2018-06-20 | Disposition: A | Discharge status: Home / Self Care | Payer: BLUE CROSS/BLUE SHIELD | Source: Ambulatory Visit | Attending: Gastroenterology | Admitting: Gastroenterology

## 2018-06-20 DIAGNOSIS — R1011 Right upper quadrant pain: Secondary | ICD-10-CM

## 2018-07-09 ENCOUNTER — Telehealth: Payer: Self-pay | Admitting: Neurology

## 2018-07-09 NOTE — Telephone Encounter (Signed)

## 2018-07-10 ENCOUNTER — Encounter: Payer: Self-pay | Admitting: Neurology

## 2018-07-10 NOTE — Addendum Note (Signed)
Addended by: Judi Cong on: 07/10/2018 10:34 AM   Modules accepted: Orders

## 2018-07-10 NOTE — Telephone Encounter (Signed)

## 2018-07-11 ENCOUNTER — Ambulatory Visit (INDEPENDENT_AMBULATORY_CARE_PROVIDER_SITE_OTHER): Payer: BC Managed Care – PPO | Admitting: Neurology

## 2018-07-11 ENCOUNTER — Other Ambulatory Visit: Payer: Self-pay

## 2018-07-11 DIAGNOSIS — Z9189 Other specified personal risk factors, not elsewhere classified: Secondary | ICD-10-CM | POA: Diagnosis not present

## 2018-07-11 DIAGNOSIS — G4730 Sleep apnea, unspecified: Secondary | ICD-10-CM | POA: Diagnosis not present

## 2018-07-11 DIAGNOSIS — G4710 Hypersomnia, unspecified: Secondary | ICD-10-CM

## 2018-07-11 NOTE — Progress Notes (Signed)
Virtual Visit via Video Note  I connected with Gabriella Campbell , MD , on 07/11/18 at  1:00 PM EDT by a video enabled telemedicine application and verified that I am speaking with the correct person using two identifiers.  Location: Patient: at home  Provider[facility]    I discussed the limitations of evaluation and management by telemedicine and the availability of in person appointments. The patient expressed understanding and agreed to proceed.      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 Dr. [name] recently underwent a colonoscopy and it was during the procedure that her gastroenterologist, Gabriella Campbell, became aware of her apnea problem. Medical history:  She has an elevated BMI at 37 kg/m2.  A history of Guillain -Barre Syndrome and she developed an injury with fracture to her left foot, followed by DVT in the left leg 11-2017.  She is no longer on anticoagulation at this time. COVID risk group: 2 ( low).  Family history of : Her father suffered from colon cancer her mother from polycythemia, there has been no other cancer history in the family.  Social history: The patient is a physician, her primary care physician is Gabriella LaniusMark Lazar, MD.  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  She is a non-smoker, she is not drinking alcohol, she also avoids caffeine.  She reports doing regular exercises routines, her hobby is going on safari and traveling in general., she is single and childless.   Sleep habits :The patient's supper time is usually 7 PM, her bedtime is around 11 PM but can be as late as midnight.  She usually falls asleep easily and she wakes rarely up.  Her sleep is not fragmented.  She rises in the morning at 8 AM she  sleeps usually on her side on a flat bed with a special anti-snooze pillow that she recently purchased.  Otherwise she would use 2 regular pillows.  When she wakes up she has noticed a dry mouth there are no headaches in the morning and she usually feels refreshed and restored.  She also states that she has no trouble with the irresistible urge to sleep or any form of excessive daytime sleepiness and naps only when she wants to she may take a 30-minute power nap but that is not a regular occurrence.   Review of Systems: Out of a complete 14 system review, the patient complains of only the following symptoms, and all other reviewed systems are negative. The patient reports that she has diarrhea with loose bowel movements when under stress, bladder spasms, she had no recent fevers, nausea, vomiting, dysphagia, has not lost sense of smell or taste.   How likely are you to doze in the following situations: 0 = not likely, 1 = slight chance, 2 = moderate chance, 3 = high chance  Sitting and Reading? Watching Television? Sitting inactive in a public place (theater or meeting)? Lying down in the afternoon when circumstances permit? Sitting and talking to someone? Sitting quietly after lunch without alcohol? In a car, while stopped for a few minutes in traffic? As a passenger in a car for an hour without a break?  Total = 2/ 24 -  very low.   Apnea was witnessed following sedation for colonoscopy.     Social History   Socioeconomic History  . Marital status: Single    Spouse name: Not on file  . Number of children: Not on file  . Years of education: Not on file  . Highest education level: Not on file  Occupational History  . Not on file  Social Needs  . Financial resource strain: Not on file  . Food insecurity:    Worry: Not on file    Inability: Not on file  . Transportation needs:    Medical: Not on file    Non-medical: Not on file  Tobacco Use  . Smoking status: Never Smoker   . Smokeless tobacco: Never Used  Substance and Sexual Activity  . Alcohol use: Not Currently  . Drug use: Not Currently  . Sexual activity: Not Currently  Lifestyle  . Physical activity:    Days per week: Not on file    Minutes per session: Not on file  . Stress: Not on file  Relationships  . Social connections:    Talks on phone: Not on file    Gets together: Not on file    Attends religious service: Not on file    Active member of club or organization: Not on file    Attends meetings of clubs or organizations: Not on file    Relationship status: Not on file  . Intimate partner violence:    Fear of current or ex partner: Not on file    Emotionally abused: Not on file    Physically abused: Not on file    Forced sexual activity: Not on file  Other Topics Concern  . Not on file  Social History Narrative  . Not on file    No family history on file.  Past Medical History:  Diagnosis Date  . DVT of axillary vein, acute (HCC) 11/2017   after fx of lt fibula . no anticoagulants since 04/2018  . Guillain Barr syndrome (HCC) 1990   none  . History of kidney stones     Past Surgical History:  Procedure Laterality Date  . COLONOSCOPY WITH ESOPHAGOGASTRODUODENOSCOPY (EGD)    . CYSTOSCOPY WITH RETROGRADE PYELOGRAM, URETEROSCOPY AND STENT PLACEMENT Left 05/11/2018   Procedure: CYSTOSCOPY WITH URETEROSCOPY WITH STONE EXTRACTION, AND STENT PLACEMENT;  Surgeon: Sebastian Ache, MD;  Location: [facility];  Service: Urology;  Laterality: Left;  1 HR  . HOLMIUM LASER APPLICATION Left 05/11/2018   Procedure: HOLMIUM LASER APPLICATION OF URETEROCELE;  Surgeon: Sebastian Ache, MD;  Location: [facility];  Service: Urology;  Laterality: Left;  . mole removed from Rt leg  age 38    No current outpatient medications on file.   No current facility-administered medications for this visit.     Allergies as of 07/11/2018 - Review Complete 07/10/2018  Allergen Reaction Noted  . Iodine Anaphylaxis  05/11/2018  . Penicillins Other (See Comments) 12/19/2017  . Thallium Other (See Comments) 05/11/2018  . Mango flavor  05/09/2018  . Other  05/09/2018  . Shellfish allergy  05/09/2018    Vitals: There were no vitals taken for this visit. Last Weight:  Wt Readings from Last 1 Encounters:  05/09/18 223 lb 9.6 oz (101.4 kg)   Last Height:   Ht Readings from Last 1 Encounters:  05/09/18 5' 6.5" (1.689 m)    Observations/Objective:   General: The patient is awake, alert and appears not in acute distress. The patient is well groomed.  Head: Normocephalic, atraumatic. Neck is supple. Mallampati 3, neck circumference: 14" Cardiovascular: deferred. Respiratory: Lungs are clear to auscultation. Skin:  Without evidence of edema, or rash Trunk: BMI is  elevated / patient  has normal posture.  Neurologic  :The patient is awake and alert, oriented to place and time.  Memory subjective described as intact. There is a normal attention span & concentration ability. Speech is fluent without dysarthria, dysphonia or aphasia. Mood and affect are appropriate. Cranial nerves:Pupils are equal in size.  Extraocular movements  in vertical and horizontal planes intact and without nystagmus.  Facial motor strength is symmetric and tongue and uvula move midline. T Shoulder shrug is normal.  Motor exam:  Normal muscle bulk and symmetric ROMh in all extremities. Coordination: Rapid alternating movements in the fingers/hands were normal without evidence of ataxia, dysmetria or tremor. Gait and station: Patient walks without assistive device    Assessment and Plan: Until recently the patient had never perceived herself as a possible sleep apnea patient.  She feels refreshed, she is not excessively daytime sleepy, she is not even aware if she snores.  She had a broken nose which resulted in the deviated nasal septum.  She had a tonsillectomy at age 44 she is otherwise not presenting with any upper airway  restriction, pulmonary disease, chronic sinusitis or other ENT problems.    Given that she is feeling overall healthy I think it would be most sensible to perform a home sleep test as a screening test for sleep apnea.   Depending on the results we can decide how to go further.   This patient prefers a HST .   Follow Up Instructions: Rv in 2-3 month with MD after Sleep test,  I will place an order for both, HST and PSG in case HST is denied( This depends on the insurance policy and carrier).  The patient may benefit from medical weight management, if interested.       I discussed the assessment and treatment plan with the patient. The patient was provided an opportunity to ask questions and all were answered. The patient agreed with the plan and demonstrated an understanding of the instructions.   The patient was advised to call back or seek an in-person evaluation if the symptoms worsen or if the condition fails to improve as anticipated.  I provided 25 minutes of non-face-to-face time during this encounter  Gabriella Novas MD 07/11/2018    Gabriella Novas, MD

## 2018-07-13 ENCOUNTER — Encounter: Payer: Self-pay | Admitting: Neurology

## 2018-07-13 DIAGNOSIS — Z9189 Other specified personal risk factors, not elsewhere classified: Secondary | ICD-10-CM | POA: Insufficient documentation

## 2018-07-13 NOTE — Patient Instructions (Signed)

## 2018-08-06 ENCOUNTER — Other Ambulatory Visit: Payer: Self-pay

## 2018-08-06 ENCOUNTER — Ambulatory Visit: Payer: BC Managed Care – PPO | Admitting: Neurology

## 2018-08-06 DIAGNOSIS — Z9189 Other specified personal risk factors, not elsewhere classified: Secondary | ICD-10-CM

## 2018-08-06 DIAGNOSIS — G4733 Obstructive sleep apnea (adult) (pediatric): Secondary | ICD-10-CM

## 2018-08-06 DIAGNOSIS — I82512 Chronic embolism and thrombosis of left femoral vein: Secondary | ICD-10-CM

## 2018-08-09 ENCOUNTER — Encounter: Payer: Self-pay | Admitting: Neurology

## 2018-08-09 DIAGNOSIS — G4733 Obstructive sleep apnea (adult) (pediatric): Secondary | ICD-10-CM | POA: Insufficient documentation

## 2018-08-09 NOTE — Addendum Note (Signed)
Addended by: Larey Seat on: 08/09/2018 10:52 AM   Modules accepted: Orders

## 2018-08-09 NOTE — Procedures (Signed)
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 Collene Mares (GI ) for suspected OSA. Gabriella Campbell recently underwent a colonoscopy and it was during the procedure that her gastroenterologist, Dr. Collene Mares, became aware of her apnea problem. Medical history:  She has an elevated BMI at 37 kg/m2.  A history of Pincus Badder -Barre Syndrome and she developed an injury with fracture to her left foot, followed by DVT in the left leg 11-2017.  She is no longer on anticoagulation at this time. COVID risk group: 2 (low).  The patient has never before considered herself affected by any sleep disordered breathing, reports her sleep has a refreshing and restorative quality and denies excessive daytime sleepiness.      Summary & Diagnosis:    Dr. Tonia Ghent HST revealed a moderate degree of obstructive sleep apnea with an overall at AHI of 18.9/h but with a strong accentuation during REM sleep to AHI of 40.2/h.  The RDI (Respiratory Disturbance Index) was elevated at 23.9/h for the general night, and exacerbated in REM sleep to 45.9/h. This RDI speaks for loud snoring and upper airway resistance -the Apnea hypopnea Index with REM sleep accentuation is attributed to reduced chest wall movement in that sleep stage.   Recommendations:      A dental device will not help a patient with REM sleep accentuated Obstructive Sleep Apnea and only CPAP or Inspire procedure are valid interventions, aside from significant weight loss.  I will order an auto-titration CPAP  device with a pressure range from 6 through 16 cm water, 3 cm EPR (Expiratory Pressure Relief) and heated humidity, an interface will be chosen by the patient, guided by what is comfortable for her. The patient may choose a travel friendly CPAP model in discussion with her DME.  Larey Seat, MD   08-09-2018               Sleep Summary  Oxygen Saturation Statistics   Start Study Time: End Study Time: Total Recording Time:  10:21:51 PM   8:12:30 AM   9 h, 50 min  Total Sleep Time % REM of Sleep Time:  8 h, 22 min  31.5    Mean: 94 Minimum: 83 Maximum: 98  Mean of Desaturations Nadirs (%):   91  Oxygen Desaturation %:   4-9 10-20 >20 Total  Events Number Total    75  3 96.2 3.8  0 0.0  78 100.0  Oxygen Saturation: <90 <=88 <85 <80 <70  Duration (minutes): Sleep % 1.3 0.3  0.7 0.0  0.1 0.0 0.0 0.0 0.0 0.0     Respiratory Indices      Total Events REM NREM All Night  pRDI:  200  pAHI:  158 ODI:  78  pAHIc:  0  % CSR: 0.0 45.9 40.2 25.0 0.0 13.8 9.1 2.1 0.0 23.9 18.9 9.3 0.0       Pulse Rate Statistics during Sleep (BPM)  Mean: 60 Minimum: 47 Maximum:  88    Indices are calculated using technically valid sleep time of  8 hrs, 21 min. Central-Indices are calculated using technically valid sleep time of  8  hrs, 17 min. pRDI/pAHI are calculated using oxi desaturations ? 3%  Body Position Statistics  Position Supine Prone Right Left Non-Supine  Sleep (min) 310.1 0.0 0.0 192.5 192.5  Sleep % 61.7 0.0 0.0 38.3 38.3  pRDI 18.4 N/A N/A 32.9 32.9  pAHI 13.4 N/A N/A 27.8 27.8  ODI 4.8 N/A N/A 16.6 16.6     Snoring Statistics Snoring Level (dB) >40 >50 >60 >70 >80 >Threshold (45)  Sleep (min) 215.4 39.4 4.5 0.0 0.0 91.4  Sleep % 42.8 7.8 0.9 0.0 0.0 18.2    Mean: 43 dB Sleep Stages Chart                             pAHI=18.9                                         Mild              Moderate                    Severe                                                  5              15                    30

## 2018-09-25 ENCOUNTER — Other Ambulatory Visit: Payer: Self-pay

## 2018-09-25 ENCOUNTER — Ambulatory Visit (INDEPENDENT_AMBULATORY_CARE_PROVIDER_SITE_OTHER): Payer: BC Managed Care – PPO | Admitting: Neurology

## 2018-09-25 ENCOUNTER — Encounter: Payer: Self-pay | Admitting: Neurology

## 2018-09-25 VITALS — BP 152/88 | HR 76 | Temp 97.5°F | Ht 66.0 in | Wt 214.0 lb

## 2018-09-25 DIAGNOSIS — F458 Other somatoform disorders: Secondary | ICD-10-CM

## 2018-09-25 DIAGNOSIS — Z789 Other specified health status: Secondary | ICD-10-CM

## 2018-09-25 DIAGNOSIS — Z9189 Other specified personal risk factors, not elsewhere classified: Secondary | ICD-10-CM | POA: Diagnosis not present

## 2018-09-25 DIAGNOSIS — G4733 Obstructive sleep apnea (adult) (pediatric): Secondary | ICD-10-CM | POA: Diagnosis not present

## 2018-09-25 NOTE — Progress Notes (Signed)
Provider:  Melvyn Novasarmen  Gabriella Sprung, MD  Referring Provider: [name], [name], MD Primary Care Physician:  Gabriella ElizabethMann, [name], MD   HPI:   Gabriella Campbell underwent a home sleep test which revealed a moderate degree of obstructive sleep apnea with an overall AHI of 18.9/h and strong accentuation during REM sleep to an AHI of 40.2 patient also seem to have been a loud snorer and there is evidence of upper airway resistance.  Gabriella Campbell is seen today after her sleep study confirmed OSA and she has started on auto-titrating CPAP.. I had originally set the machine between 6-16 cm water. She felt as if she swallowed air, she had sinus pressure.  The CPAP was titrated down to 5 through 7 cm water, with an AHI of 0.6 /h. She still hates it , her own newly aquiered  Pulse-oximeter showed good 02 sats. She lost 20 pounds and feels better, certainly decreasing her pressure needs. She hates her CPAP.  The patient has used the CPAP at various pressures 100% for the last 30 days and does not average 7 nights 7 hours and 41 minutes.  The current setting is 4 to 5 cm water pressure is 1 cm EPR residual AHI has remained at 0.6.  This speaks for a good resolution at minimal pressures.  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 Loreta Campbell for suspected OSA. Dr. [name] recently underwent a colonoscopy and it was during the procedure that her gastroenterologist, Dr. Loreta Campbell, became aware of her apnea problem. Medical history:  She has an elevated BMI at 37 kg/m2.  A history of Guillain -Barre Syndrome and she developed an injury with fracture to her left foot, followed by DVT in the left leg 11-2017.  She is no longer on anticoagulation at this time. COVID risk group: 2 ( low).  Until recently the patient had never perceived herself as a possible sleep apnea patient.  She feels refreshed, she is not excessively daytime sleepy, she is not even aware if she  snores.  She had a broken nose which resulted in the deviated nasal septum.  She had a tonsillectomy at age 89 she is otherwise not presenting with any upper airway restriction, pulmonary disease, chronic sinusitis or other ENT problems.    Given that she is feeling overall healthy I think it would be most sensible to perform a home sleep test as a screening test for sleep apnea.   Depending on the results we can decide how to go further.   Family history of : Her father suffered from colon cancer her mother from polycythemia, there has been no other cancer history in the family.  Social history: The patient is a physician, her primary care physician is [name], MD.  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  She is a non-smoker, she is not drinking alcohol, she also avoids caffeine.  She reports doing regular exercises routines, her hobby is going on safari and traveling in general., she is single and childless.   Sleep habits :The patient's supper time is usually 7 PM, her bedtime is around 11 PM but can be as late as midnight.  She usually falls asleep easily and she wakes rarely up.  Her sleep is not fragmented.  She rises in the morning at 8 AM she sleeps usually on her side on  a flat bed with a special anti-snooze pillow that she recently purchased.  Otherwise she would use 2 regular pillows.  When she wakes up she has noticed a dry mouth there are no headaches in the morning and she usually feels refreshed and restored.  She also states that she has no trouble with the irresistible urge to sleep or any form of excessive daytime sleepiness and naps only when she wants to she may take a 30-minute power nap but that is not a regular occurrence.   Review of Systems: Out of a complete 14 system review, the patient complains of only the following symptoms, and all other reviewed systems are negative. The patient reports that she has diarrhea with loose bowel  movements when under stress, bladder spasms, she had no recent fevers, nausea, vomiting, dysphagia, has not lost sense of smell or taste.   How likely are you to doze in the following situations: 0 = not likely, 1 = slight chance, 2 = moderate chance, 3 = high chance  Sitting and Reading? Watching Television? Sitting inactive in a public place (theater or meeting)? Lying down in the afternoon when circumstances permit? Sitting and talking to someone? Sitting quietly after lunch without alcohol? In a car, while stopped for a few minutes in traffic? As a passenger in a car for an hour without a break?  Total = 2/ 24 - very low.   Apnea was witnessed following sedation for colonoscopy.     Social History   Socioeconomic History  . Marital status: Single    Spouse name: Not on file  . Number of children: Not on file  . Years of education: Not on file  . Highest education level: Not on file  Occupational History  . Not on file  Social Needs  . Financial resource strain: Not on file  . Food insecurity    Worry: Not on file    Inability: Not on file  . Transportation needs    Medical: Not on file    Non-medical: Not on file  Tobacco Use  . Smoking status: Never Smoker  . Smokeless tobacco: Never Used  Substance and Sexual Activity  . Alcohol use: Not Currently  . Drug use: Not Currently  . Sexual activity: Not Currently  Lifestyle  . Physical activity    Days per week: Not on file    Minutes per session: Not on file  . Stress: Not on file  Relationships  . Social Herbalist on phone: Not on file    Gets together: Not on file    Attends religious service: Not on file    Active member of club or organization: Not on file    Attends meetings of clubs or organizations: Not on file    Relationship status: Not on file  . Intimate partner violence    Fear of current or ex partner: Not on file    Emotionally abused: Not on file    Physically abused: Not on  file    Forced sexual activity: Not on file  Other Topics Concern  . Not on file  Social History Narrative  . Not on file    No family history on file.  Past Medical History:  Diagnosis Date  . DVT of axillary vein, acute ([place]) 11/2017   after fx of lt fibula . no anticoagulants since 04/2018  . Guillain Barr syndrome ([place]) 1990   none  . History of kidney stones  Past Surgical History:  Procedure Laterality Date  . COLONOSCOPY WITH ESOPHAGOGASTRODUODENOSCOPY (EGD)    . CYSTOSCOPY WITH RETROGRADE PYELOGRAM, URETEROSCOPY AND STENT PLACEMENT Left 05/11/2018   Procedure: CYSTOSCOPY WITH URETEROSCOPY WITH STONE EXTRACTION, AND STENT PLACEMENT;  Surgeon: Sebastian AcheManny, Theodore, MD;  Location: [facility];  Service: Urology;  Laterality: Left;  1 HR  . HOLMIUM LASER APPLICATION Left 05/11/2018   Procedure: HOLMIUM LASER APPLICATION OF URETEROCELE;  Surgeon: Sebastian AcheManny, Theodore, MD;  Location: [facility];  Service: Urology;  Laterality: Left;  . mole removed from Rt leg  age 63    No current outpatient medications on file.   No current facility-administered medications for this visit.     Allergies as of 09/25/2018 - Review Complete 09/25/2018  Allergen Reaction Noted  . Iodine Anaphylaxis 05/11/2018  . Penicillins Other (See Comments) 12/19/2017  . Thallium Other (See Comments) 05/11/2018  . Mango flavor  05/09/2018  . Other  05/09/2018  . Shellfish allergy  05/09/2018    Vitals: BP (!) 152/88   Pulse 76   Temp (!) 97.5 F (36.4 C)   Ht 5\' 6"  (1.676 m)   Wt 214 lb (97.1 kg)   BMI 34.54 kg/m  Last Weight:  Wt Readings from Last 1 Encounters:  09/25/18 214 lb (97.1 kg)   Last Height:   Ht Readings from Last 1 Encounters:  09/25/18 5\' 6"  (1.676 m)    Observations/Objective:   General: The patient is awake, alert and appears not in acute distress. The patient is well groomed. Head: Normocephalic, atraumatic. Neck is supple. Mallampati 3, neck circumference: 14.5 " Cardiovascular:  deferred. Respiratory: Lungs are clear to auscultation. Skin:  Without evidence of edema, or rash Trunk: BMI is elevated at 34 kg/m2  / patient  has normal posture.  Neurologic  :The patient is awake and alert, oriented to place and time.  Memory subjective described as intact. There is a normal attention span & concentration ability.  Speech is fluent without dysarthria, dysphonia or aphasia.  Mood and affect are appropriate.  Cranial nerves: Pupils are equal in size.  Extraocular movements  in vertical and horizontal planes intact and without nystagmus.  Facial motor strength is symmetric and tongue and uvula move midline. T Shoulder shrug is normal.  Motor exam:  Normal muscle bulk and symmetric ROM in all extremities. Coordination: Rapid alternating movements in the fingers/hands were normal without evidence of ataxia, dysmetria or tremor. Gait and station: Patient walks without assistive device    Assessment and Plan:  Patient dislikes CPAP intensely and will change to sleep dentistry treatment.   Referring to Dr Irene LimboSandra Fuller, DDS.  HST after dental device is titrating.    I discussed the assessment and treatment plan with the patient. The patient was provided an opportunity to ask questions and all were answered. The patient agreed with the plan and demonstrated an understanding of the instructions.   The patient was advised to call back or seek an in-person evaluation if the symptoms worsen or if the condition fails to improve as anticipated.  I provided 25 minutes of -face-to-face time during this encounter  Melvyn Novasarmen Teniya Filter MD 07/11/2018    Melvyn Novasarmen Kevante Lunt, MD

## 2019-04-12 ENCOUNTER — Encounter: Payer: Self-pay | Admitting: Neurology

## 2021-03-19 IMAGING — US ULTRASOUND ABDOMEN LIMITED
1 series · 14 of 25 positions shown · non-contrast
Comparison: [HOSPITAL] CT Abdomen and Pelvis
05/08/2018.

CLINICAL DATA: 63-year-old female with right upper quadrant
abdominal pain for 1 month.

EXAM:
ULTRASOUND ABDOMEN LIMITED RIGHT UPPER QUADRANT

[Series 1: ultrasound abdomen limited · 0.17mm/px · 14 of 53 slices shown]
[im 1/53]
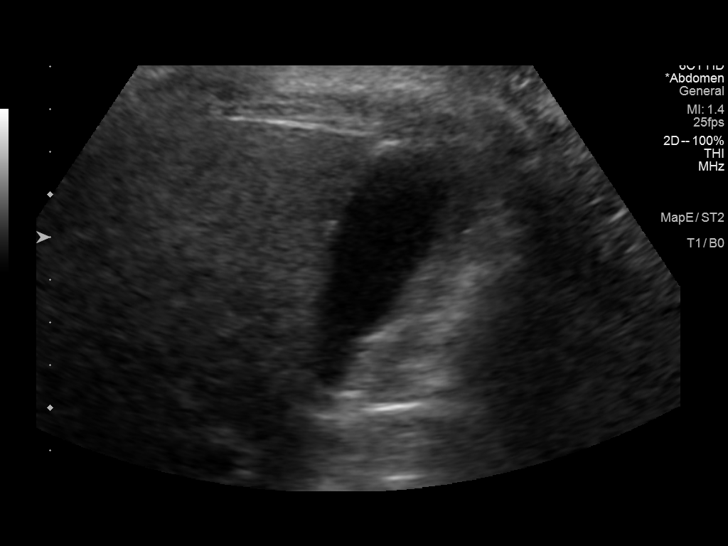
[im 5/53]
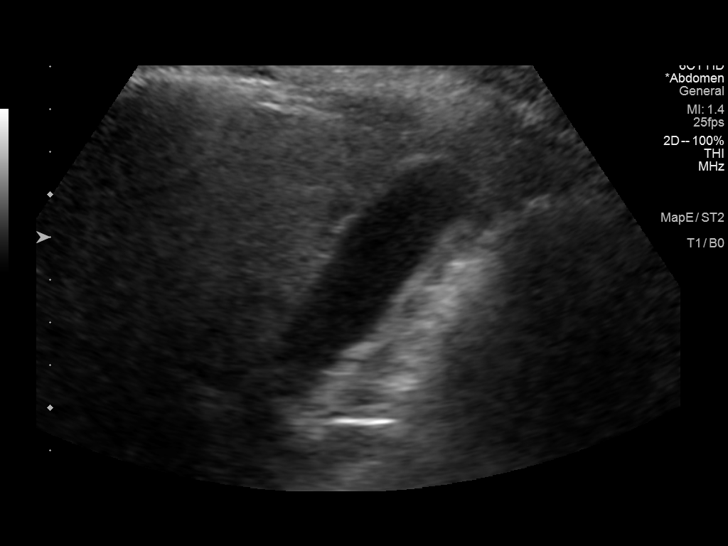
[im 9/53]
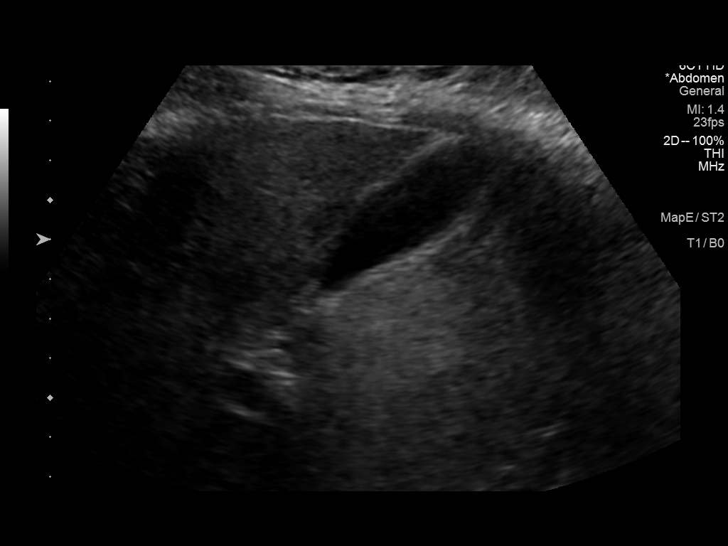
[im 14/53]
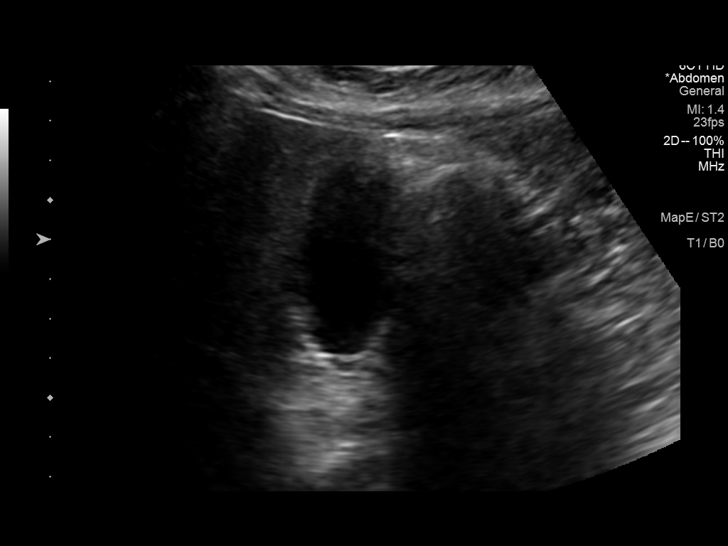
[im 18/53]
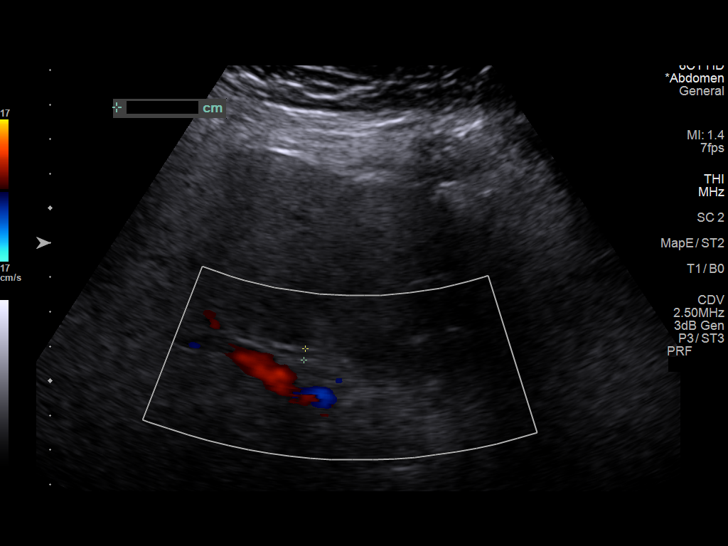
[im 20/53]
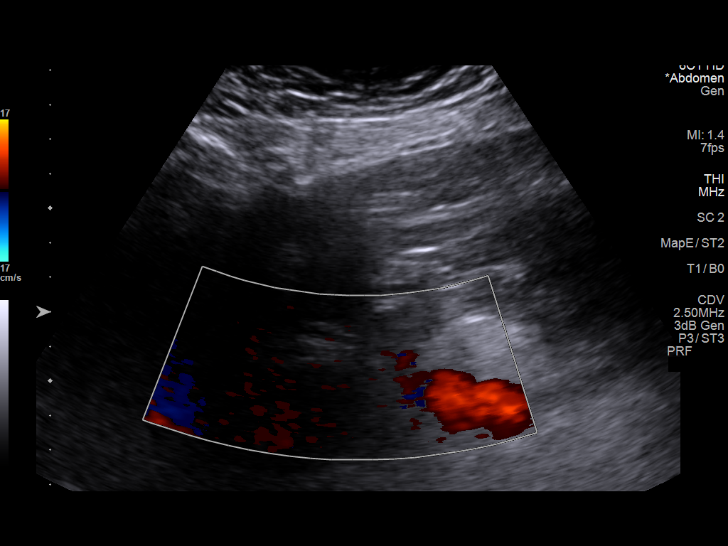
[im 24/53]
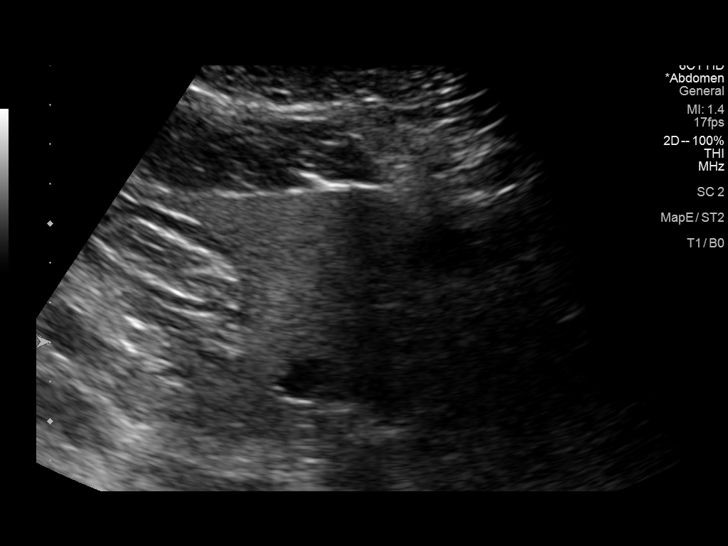
[im 29/53]
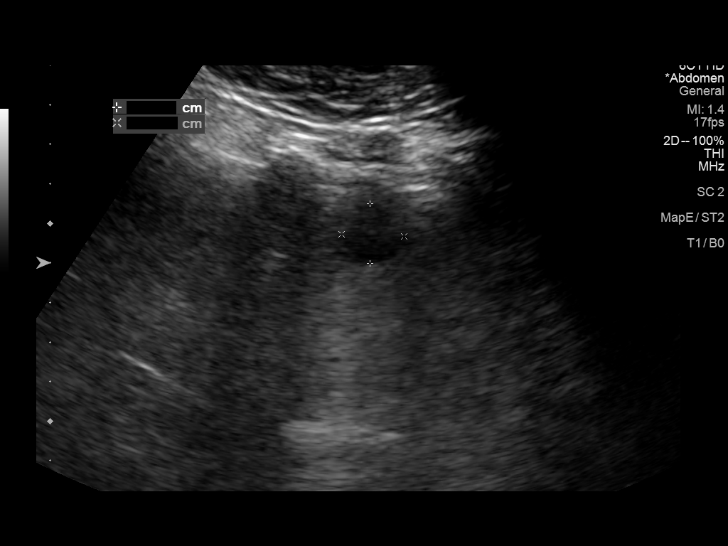
[im 33/53]
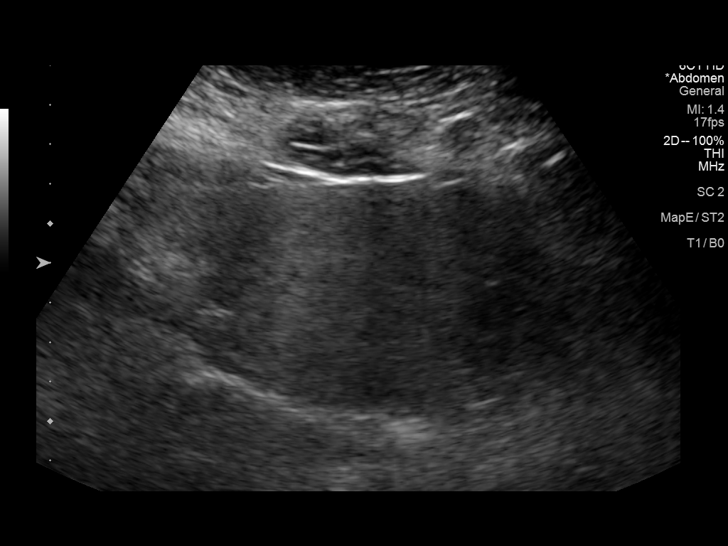
[im 35/53]
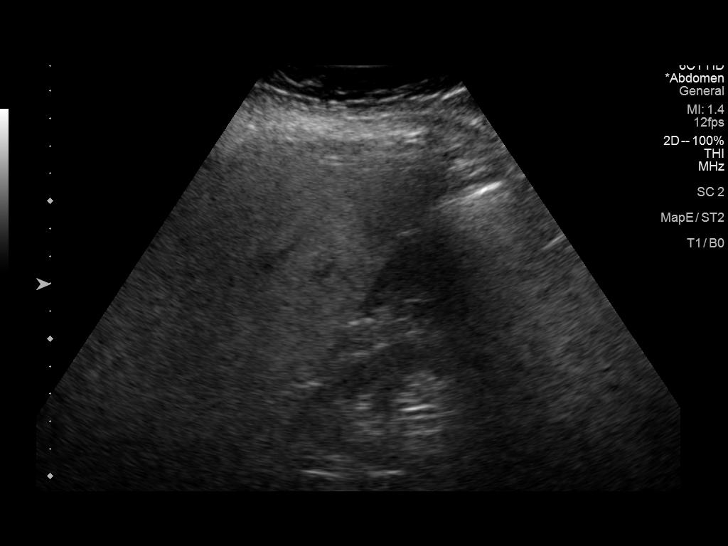
[im 40/53]
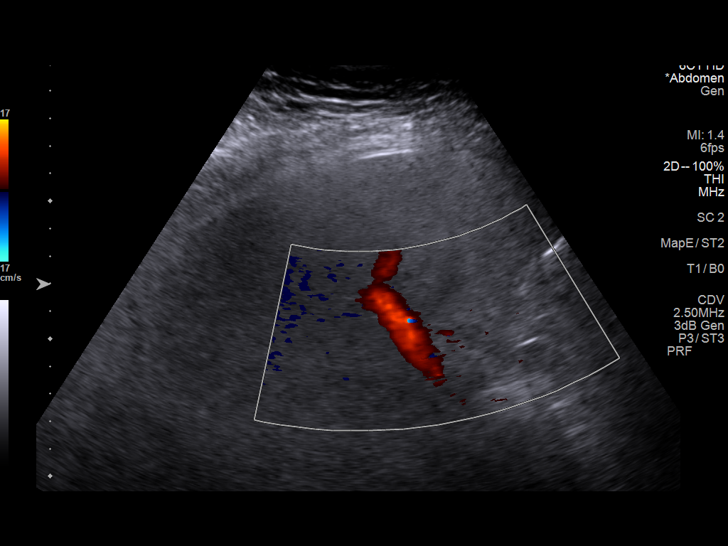
[im 44/53]
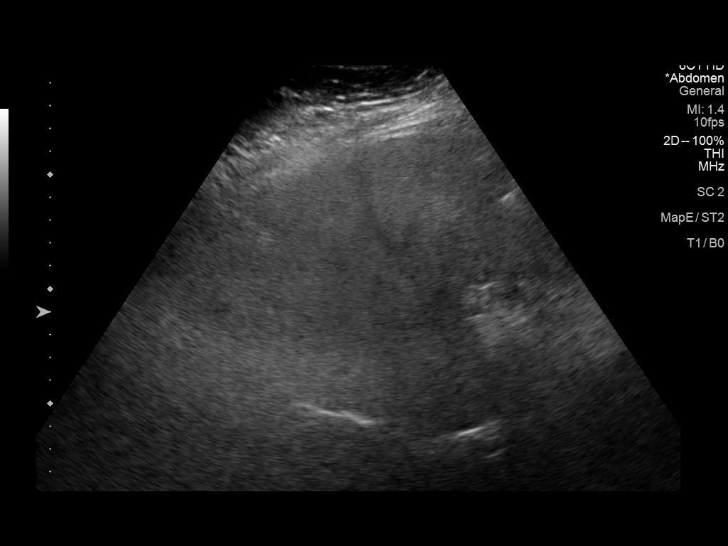
[im 48/53]
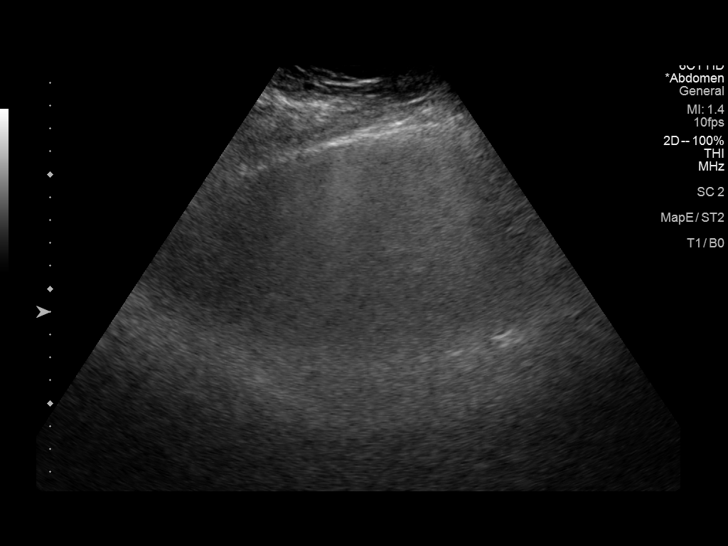
[im 53/53]
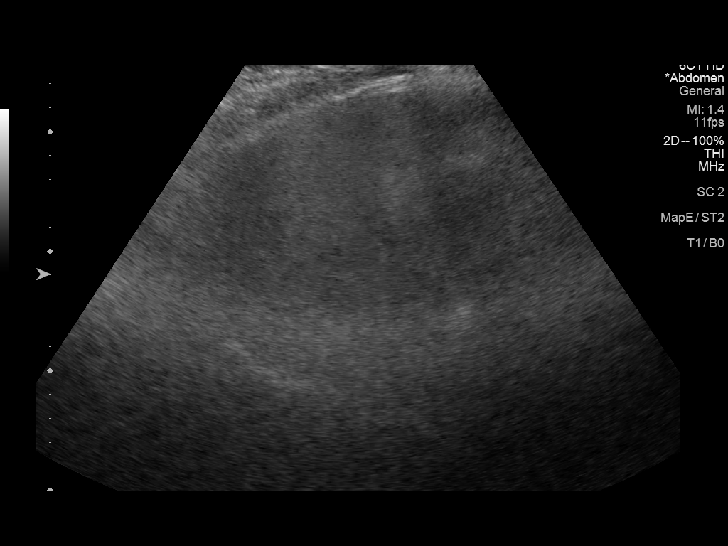

[14 of 25 positions shown; findings below may reference images not displayed]

FINDINGS: Gallbladder:

No gallstones or wall thickening visualized. No sonographic Murphy
sign noted by sonographer.

Common bile duct:

Diameter: 3 millimeters, normal.

Liver:

Echogenic liver (image 36). There are multiple small hypoechoic
areas identified compatible with the larger simple appearing cysts
on the comparison CT. These measure 11-16 millimeters, and have a
benign cyst appearance by ultrasound. No new liver lesion. Portal
vein is patent on color Doppler imaging with normal direction of
blood flow towards the liver.

Other findings: Negative visible right kidney.
IMPRESSION: 1. Normal gallbladder.  No evidence of bile duct obstruction.
2. Evidence of hepatic steatosis.  Small benign hepatic cysts.
